# Patient Record
Sex: Female | Born: 1950
Health system: Southern US, Community
[De-identification: ages and names within clinical notes are randomized; demographics above are authoritative.]

## PROBLEM LIST (undated history)

## (undated) DIAGNOSIS — M8580 Other specified disorders of bone density and structure, unspecified site: Secondary | ICD-10-CM

## (undated) DIAGNOSIS — J948 Other specified pleural conditions: Secondary | ICD-10-CM

## (undated) DIAGNOSIS — E785 Hyperlipidemia, unspecified: Secondary | ICD-10-CM

## (undated) DIAGNOSIS — L739 Follicular disorder, unspecified: Secondary | ICD-10-CM

## (undated) HISTORY — DX: Follicular disorder, unspecified: L73.9

## (undated) HISTORY — DX: Hyperlipidemia, unspecified: E78.5

## (undated) HISTORY — DX: Other specified pleural conditions: J94.8

## (undated) HISTORY — DX: Other specified disorders of bone density and structure, unspecified site: M85.80

---

## 1997-05-28 ENCOUNTER — Other Ambulatory Visit: Admission: RE | Admit: 1997-05-28 | Discharge: 1997-05-28 | Payer: Self-pay | Admitting: Gynecology

## 1998-01-04 ENCOUNTER — Other Ambulatory Visit: Admission: RE | Admit: 1998-01-04 | Discharge: 1998-01-04 | Payer: Self-pay | Admitting: Gynecology

## 1999-02-04 ENCOUNTER — Encounter: Payer: Self-pay | Admitting: Gynecology

## 1999-02-04 ENCOUNTER — Encounter: Admission: RE | Admit: 1999-02-04 | Discharge: 1999-02-04 | Payer: Self-pay | Admitting: Gynecology

## 1999-02-13 ENCOUNTER — Other Ambulatory Visit: Admission: RE | Admit: 1999-02-13 | Discharge: 1999-02-13 | Payer: Self-pay | Admitting: Gynecology

## 1999-05-21 ENCOUNTER — Encounter (INDEPENDENT_AMBULATORY_CARE_PROVIDER_SITE_OTHER): Payer: Self-pay

## 1999-05-21 ENCOUNTER — Other Ambulatory Visit: Admission: RE | Admit: 1999-05-21 | Discharge: 1999-05-21 | Payer: Self-pay | Admitting: Gynecology

## 2000-02-10 ENCOUNTER — Encounter: Admission: RE | Admit: 2000-02-10 | Discharge: 2000-02-10 | Payer: Self-pay | Admitting: Gynecology

## 2000-02-10 ENCOUNTER — Encounter: Payer: Self-pay | Admitting: Gynecology

## 2001-02-22 ENCOUNTER — Other Ambulatory Visit: Admission: RE | Admit: 2001-02-22 | Discharge: 2001-02-22 | Payer: Self-pay | Admitting: Gynecology

## 2002-02-22 ENCOUNTER — Other Ambulatory Visit: Admission: RE | Admit: 2002-02-22 | Discharge: 2002-02-22 | Payer: Self-pay | Admitting: Gynecology

## 2002-03-21 ENCOUNTER — Encounter: Admission: RE | Admit: 2002-03-21 | Discharge: 2002-03-21 | Payer: Self-pay | Admitting: Gynecology

## 2002-03-21 ENCOUNTER — Encounter: Payer: Self-pay | Admitting: Gynecology

## 2003-03-20 ENCOUNTER — Other Ambulatory Visit: Admission: RE | Admit: 2003-03-20 | Discharge: 2003-03-20 | Payer: Self-pay | Admitting: Gynecology

## 2003-03-21 ENCOUNTER — Other Ambulatory Visit: Admission: RE | Admit: 2003-03-21 | Discharge: 2003-03-21 | Payer: Self-pay | Admitting: Gynecology

## 2003-03-27 ENCOUNTER — Encounter: Admission: RE | Admit: 2003-03-27 | Discharge: 2003-03-27 | Payer: Self-pay | Admitting: Gynecology

## 2003-10-03 ENCOUNTER — Other Ambulatory Visit: Admission: RE | Admit: 2003-10-03 | Discharge: 2003-10-03 | Payer: Self-pay | Admitting: Gynecology

## 2004-04-14 ENCOUNTER — Other Ambulatory Visit: Admission: RE | Admit: 2004-04-14 | Discharge: 2004-04-14 | Payer: Self-pay | Admitting: Gynecology

## 2004-05-06 ENCOUNTER — Encounter: Admission: RE | Admit: 2004-05-06 | Discharge: 2004-05-06 | Payer: Self-pay | Admitting: Gynecology

## 2004-11-13 ENCOUNTER — Other Ambulatory Visit: Admission: RE | Admit: 2004-11-13 | Discharge: 2004-11-13 | Payer: Self-pay | Admitting: Gynecology

## 2005-05-20 ENCOUNTER — Encounter: Admission: RE | Admit: 2005-05-20 | Discharge: 2005-05-20 | Payer: Self-pay | Admitting: Gynecology

## 2005-11-10 ENCOUNTER — Other Ambulatory Visit: Admission: RE | Admit: 2005-11-10 | Discharge: 2005-11-10 | Payer: Self-pay | Admitting: Gynecology

## 2006-07-13 ENCOUNTER — Encounter: Admission: RE | Admit: 2006-07-13 | Discharge: 2006-07-13 | Payer: Self-pay | Admitting: Gynecology

## 2007-08-04 ENCOUNTER — Encounter: Admission: RE | Admit: 2007-08-04 | Discharge: 2007-08-04 | Payer: Self-pay | Admitting: Gynecology

## 2008-08-21 ENCOUNTER — Encounter: Admission: RE | Admit: 2008-08-21 | Discharge: 2008-08-21 | Payer: Self-pay | Admitting: Gynecology

## 2009-08-23 ENCOUNTER — Encounter: Admission: RE | Admit: 2009-08-23 | Discharge: 2009-08-23 | Payer: Self-pay | Admitting: Gynecology

## 2010-07-29 ENCOUNTER — Other Ambulatory Visit: Payer: Self-pay | Admitting: Gynecology

## 2010-07-29 DIAGNOSIS — Z1231 Encounter for screening mammogram for malignant neoplasm of breast: Secondary | ICD-10-CM

## 2010-09-16 ENCOUNTER — Ambulatory Visit
Admission: RE | Admit: 2010-09-16 | Discharge: 2010-09-16 | Disposition: A | Discharge status: Home / Self Care | Payer: BC Managed Care – PPO | Source: Ambulatory Visit | Attending: Gynecology | Admitting: Gynecology

## 2010-09-16 DIAGNOSIS — Z1231 Encounter for screening mammogram for malignant neoplasm of breast: Secondary | ICD-10-CM

## 2011-08-26 ENCOUNTER — Other Ambulatory Visit: Payer: Self-pay | Admitting: Gynecology

## 2011-08-26 DIAGNOSIS — Z1231 Encounter for screening mammogram for malignant neoplasm of breast: Secondary | ICD-10-CM

## 2011-09-17 ENCOUNTER — Ambulatory Visit
Admission: RE | Admit: 2011-09-17 | Discharge: 2011-09-17 | Disposition: A | Discharge status: Home / Self Care | Payer: BC Managed Care – PPO | Source: Ambulatory Visit | Attending: Gynecology | Admitting: Gynecology

## 2011-09-17 DIAGNOSIS — Z1231 Encounter for screening mammogram for malignant neoplasm of breast: Secondary | ICD-10-CM

## 2012-08-08 ENCOUNTER — Other Ambulatory Visit: Payer: Self-pay

## 2012-08-08 DIAGNOSIS — Z1231 Encounter for screening mammogram for malignant neoplasm of breast: Secondary | ICD-10-CM

## 2012-09-28 ENCOUNTER — Ambulatory Visit
Admission: RE | Admit: 2012-09-28 | Discharge: 2012-09-28 | Disposition: A | Discharge status: Home / Self Care | Payer: BC Managed Care – PPO | Source: Ambulatory Visit

## 2012-09-28 DIAGNOSIS — Z1231 Encounter for screening mammogram for malignant neoplasm of breast: Secondary | ICD-10-CM

## 2013-09-01 ENCOUNTER — Other Ambulatory Visit: Payer: Self-pay

## 2013-09-01 DIAGNOSIS — Z1231 Encounter for screening mammogram for malignant neoplasm of breast: Secondary | ICD-10-CM

## 2013-10-02 ENCOUNTER — Ambulatory Visit
Admission: RE | Admit: 2013-10-02 | Discharge: 2013-10-02 | Disposition: A | Discharge status: Home / Self Care | Payer: BC Managed Care – PPO | Source: Ambulatory Visit

## 2013-10-02 DIAGNOSIS — Z1231 Encounter for screening mammogram for malignant neoplasm of breast: Secondary | ICD-10-CM

## 2019-03-23 ENCOUNTER — Other Ambulatory Visit: Payer: Self-pay | Admitting: Internal Medicine

## 2019-03-23 DIAGNOSIS — E785 Hyperlipidemia, unspecified: Secondary | ICD-10-CM

## 2019-04-06 ENCOUNTER — Ambulatory Visit: Payer: BC Managed Care – PPO

## 2019-04-06 ENCOUNTER — Ambulatory Visit
Admission: RE | Admit: 2019-04-06 | Discharge: 2019-04-06 | Disposition: A | Discharge status: Home / Self Care | Payer: No Typology Code available for payment source | Source: Ambulatory Visit | Attending: Internal Medicine | Admitting: Internal Medicine

## 2019-04-06 DIAGNOSIS — E785 Hyperlipidemia, unspecified: Secondary | ICD-10-CM

## 2019-04-17 ENCOUNTER — Other Ambulatory Visit: Payer: Self-pay | Admitting: Internal Medicine

## 2019-04-17 ENCOUNTER — Ambulatory Visit: Payer: BC Managed Care – PPO

## 2019-04-17 DIAGNOSIS — R222 Localized swelling, mass and lump, trunk: Secondary | ICD-10-CM

## 2019-04-28 ENCOUNTER — Other Ambulatory Visit: Payer: BC Managed Care – PPO

## 2019-05-08 ENCOUNTER — Ambulatory Visit
Admission: RE | Admit: 2019-05-08 | Discharge: 2019-05-08 | Disposition: A | Discharge status: Home / Self Care | Payer: BC Managed Care – PPO | Source: Ambulatory Visit | Attending: Internal Medicine | Admitting: Internal Medicine

## 2019-05-08 ENCOUNTER — Other Ambulatory Visit: Payer: Self-pay

## 2019-05-08 DIAGNOSIS — R918 Other nonspecific abnormal finding of lung field: Secondary | ICD-10-CM | POA: Diagnosis not present

## 2019-05-08 DIAGNOSIS — R222 Localized swelling, mass and lump, trunk: Secondary | ICD-10-CM

## 2019-05-08 MED ORDER — IOPAMIDOL (ISOVUE-300) INJECTION 61%
75.0000 mL | Freq: Once | INTRAVENOUS | Status: AC | PRN
  Administered 2019-05-08: 75 mL via INTRAVENOUS

## 2019-05-16 ENCOUNTER — Other Ambulatory Visit: Payer: Self-pay

## 2019-05-16 ENCOUNTER — Institutional Professional Consult (permissible substitution): Payer: Medicare PPO | Admitting: Cardiothoracic Surgery

## 2019-05-16 VITALS — BP 133/76 | HR 81 | Temp 97.9°F | Resp 20 | Ht 68.25 in | Wt 156.0 lb

## 2019-05-16 DIAGNOSIS — J948 Other specified pleural conditions: Secondary | ICD-10-CM | POA: Insufficient documentation

## 2019-05-16 DIAGNOSIS — N905 Atrophy of vulva: Secondary | ICD-10-CM | POA: Insufficient documentation

## 2019-05-16 DIAGNOSIS — Z780 Asymptomatic menopausal state: Secondary | ICD-10-CM | POA: Insufficient documentation

## 2019-05-16 DIAGNOSIS — R222 Localized swelling, mass and lump, trunk: Secondary | ICD-10-CM

## 2019-05-16 NOTE — Progress Notes (Signed)
[address]       [place] [address]             [phone]                    Francine C Doan [facility] Medical Record [id number] Date of Birth: Aug 18, 1950  Referring: Haywood Pao, MD Primary Care: Tisovec, Fransico Him, MD Primary Cardiologist: No primary care provider on file.  Chief Complaint:    Chief Complaint  Patient presents with  . Consult    Surgical eval on Pleural mass, Chest CT 05/08/19, Cardiac CT 04/06/19    History of Present Illness:    Beth Dean 69 y.o. female is seen in the office  today for abnormal CT of the chest.  The patient noted because of elevation in her cholesterol level a CT was done for calcium scoring-on the CT scan was noted and incompletely imaged right lower pleural base chest mass.  Follow-up full CT of the chest was done and the patient referred to thoracic surgery office.   Patient has been healthy with no significant medical issues.  She specifically denies any shortness of breath chest discomfort has had no pain in the right lower chest or dysesthesias of the right chest.   She smoked 2 to 3 years while in college 40 years ago none since   She notes recently a small lesion squamous cell carcinoma of the skin was removed from her right forearm  Current Activity/ Functional Status:  Patient is independent with mobility/ambulation, transfers, ADL's, IADL's.   Zubrod Score: At the time of surgery this patient's most appropriate activity status/level should be described as: [x]     0    Normal activity, no symptoms []     1    Restricted in physical strenuous activity but ambulatory, able to do out light work []     2    Ambulatory and capable of self care, unable to do work activities, up and about               >50 % of waking hours                              []     3    Only limited self care, in bed greater than 50% of waking hours []     4    Completely disabled, no self care, confined to bed or  chair []     5    Moribund   Past Medical History:  Diagnosis Date  . Folliculitis   . Hyperlipidemia   . Osteopenia   . Pleural mass     Past Surgical History:  Procedure Laterality Date  . LEEP    . TONSILLECTOMY      Family History  Problem Relation Age of Onset  . Heart disease Mother   . Cancer Father   . Hyperlipidemia Sister    Family history significant for her father who died at age 17 of lung cancer metastatic to the brain, mother had bypass surgery ultimately died of a stroke at age 23. -2 children who are healthy  Social History   Tobacco Use  Smoking Status Former Research scientist (life sciences)  . Types: Cigarettes  Smokeless Tobacco Never Used  Tobacco Comment   quit 35 years ago    Social History   Substance and Sexual Activity  Alcohol Use Yes  .  Alcohol/week: 1.0 standard drinks  . Types: 1 Glasses of wine per week     No Known Allergies  Current Outpatient Medications  Medication Sig Dispense Refill  . fenoprofen (NALFON) 600 MG TABS tablet Take 600 mg by mouth.    . Multiple Vitamin (MULTI-VITAMIN DAILY PO) Take by mouth.    . rosuvastatin (CRESTOR) 10 MG tablet     . zinc gluconate 50 MG tablet Take 50 mg by mouth daily.     No current facility-administered medications for this visit.      Review of Systems:     Cardiac Review of Systems: [Y] = yes  or   [ N ] = no   Chest Pain [  n  ]  Resting SOB [n  ] Exertional SOB  [  n]  Orthopnea [ n ]   Pedal Edema [ n  ]    Palpitations [ n ] Syncope  [  n]   Presyncope [  n ]   General Review of Systems: [Y] = yes [  ]=no Constitional: recent weight change [  ];  Wt loss over the last 3 months [   ] anorexia [  ]; fatigue [  ]; nausea [  ]; night sweats [  ]; fever [  ]; or chills [  ];           Eye : blurred vision [  ]; diplopia [   ]; vision changes [  ];  Amaurosis fugax[  ]; Resp: cough [  ];  wheezing[  ];  hemoptysis[  ]; shortness of breath[  ]; paroxysmal nocturnal dyspnea[  ]; dyspnea on exertion[  ];  or orthopnea[  ];  GI:  gallstones[  ], vomiting[  ];  dysphagia[  ]; melena[  ];  hematochezia [  ]; heartburn[  ];   Hx of  Colonoscopy[  ]; GU: kidney stones [  ]; hematuria[  ];   dysuria [  ];  nocturia[  ];  history of     obstruction [  ]; urinary frequency [  ]             Skin: rash, swelling[  ];, hair loss[  ];  peripheral edema[  ];  or itching[  ]; Musculosketetal: myalgias[  ];  joint swelling[  ];  joint erythema[  ];  joint pain[  ];  back pain[  ];  Heme/Lymph: bruising[  ];  bleeding[  ];  anemia[  ];  Neuro: TIA[  ];  headaches[  ];  stroke[  ];  vertigo[  ];  seizures[  ];   paresthesias[  ];  difficulty walking[  ];  Psych:depression[  ]; anxiety[  ];  Endocrine: diabetes[  ];  thyroid dysfunction[  ];  Immunizations: Flu up to date [  ]; Pneumococcal up to date [  ];  Other:     PHYSICAL EXAMINATION: BP 133/76   Pulse 81   Temp 97.9 F (36.6 C) (Skin)   Resp 20   Ht 5' 8.25" (1.734 m)   Wt 156 lb (70.8 kg)   SpO2 95%   BMI 23.55 kg/m  General appearance: alert, cooperative and no distress Head: Normocephalic, without obvious abnormality, atraumatic Neck: no adenopathy, no carotid bruit, no JVD, supple, symmetrical, trachea midline and thyroid not enlarged, symmetric, no tenderness/mass/nodules Lymph nodes: Cervical, supraclavicular, and axillary nodes normal. Resp: clear to auscultation bilaterally Cardio: regular rate and rhythm, S1, S2 normal, no murmur, click, rub or  gallop GI: soft, non-tender; bowel sounds normal; no masses,  no organomegaly Extremities: extremities normal, atraumatic, no cyanosis or edema Neurologic: Grossly normal  Diagnostic Studies & Laboratory data:     Recent Radiology Findings:   CT CHEST W CONTRAST  Result Date: 05/09/2019 CLINICAL DATA:  Incidental pulmonary mass identified on chest CT for calcium scoring. EXAM: CT CHEST WITH CONTRAST TECHNIQUE: Multidetector CT imaging of the chest was performed during intravenous contrast  administration. CONTRAST:  36mL ISOVUE-300 IOPAMIDOL (ISOVUE-300) INJECTION 61% COMPARISON:  CT 04/06/2019 FINDINGS: Cardiovascular: No significant vascular findings. Normal heart size. No pericardial effusion. Mediastinum/Nodes: No axillary or supraclavicular adenopathy. No mediastinal hilar adenopathy. No pericardial effusion. Esophagus normal. Lungs/Pleura: Ovoid solid mass along the pleural surface of the RIGHT lower lobe measures 7.9 x 3.5 by 4.3 cm. The mass is smoothly marginated and shares a broad 7 cm surface with the posterior pleura as well as the pleural surface of the diaphragm. There is vascular supply from the a pulmonary artery with serpiginous vessels noted within the mass. No evidence of involvement of the chest wall or adjacent ribs. Elsewhere in the lungs no nodularity.  Airways are normal. Upper Abdomen: Limited view of the liver, kidneys, pancreas are unremarkable. Normal adrenal glands. Musculoskeletal: No aggressive osseous lesion. IMPRESSION: 1. 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 Recommend cardiothoracic surgery consultation for potential resection. Multidisciplinary Thoracic Clinic [phone]. 2. Otherwise normal CT of thorax. These results will be called to the ordering clinician or representative by the Radiologist Assistant, and communication documented in the PACS or zVision Dashboard. Electronically Signed   By: Suzy Bouchard M.D.   On: 05/09/2019 09:37     I have independently reviewed the above radiology studies  and reviewed the findings with the patient.  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  We reviewed that optimal treatment involved resection.  The typical course is slow growth.  Patient was hesitant about proceeding immediately to surgery.  We suggested alternative was needle biopsy of the mass to obtain a pathologic diagnosis and follow-up with CT scan with possible suction in the summer.  Patient was somewhat hesitant about needle biopsy and will think it over it and call us back, at the minimum will obtain a follow-up CT scan in June 2021.     Beth Isaac MD      [address] ,[place] [address] Office [phone]     05/16/2019 5:46 PM

## 2019-05-17 ENCOUNTER — Encounter: Payer: Self-pay | Admitting: Internal Medicine

## 2019-05-18 ENCOUNTER — Other Ambulatory Visit: Payer: Self-pay | Admitting: Cardiothoracic Surgery

## 2019-05-18 ENCOUNTER — Encounter (HOSPITAL_COMMUNITY): Payer: Self-pay | Admitting: Radiology

## 2019-05-18 DIAGNOSIS — R222 Localized swelling, mass and lump, trunk: Secondary | ICD-10-CM

## 2019-05-18 NOTE — Progress Notes (Signed)
Beth Dean Female, 69 y.o., [birth year] MRN:  [id number] Phone:  [phone] [name]) PCP:  Haywood Pao, MD Coverage:  [facility] Medicare/[employer] Medicare Choice Ppo  RE: CT Biopsy Received: Today Message Contents  Sandi Mariscal, MD  Garth Bigness D  CT guided R LL pleural based mass - Chest CT 05/08/2019 - image 125, series 2.   Per Dr. Everrett Coombe note - pt does NOT want surgery but has agreed to Bx.    Please schedule at [facility].   [name]       Previous Messages   ----- Message -----  From: Garth Bigness D  Sent: 05/18/2019  1:49 PM EST  To: [facility] Procedure Requests  Subject: CT Biopsy                     Procedure: CT Biopsy   Reason: Chest wall Mass, right   History: CT in computer   Provider: Grace Isaac   Provider Contact:  [phone]

## 2019-05-26 DIAGNOSIS — E7849 Other hyperlipidemia: Secondary | ICD-10-CM | POA: Diagnosis not present

## 2019-05-27 ENCOUNTER — Ambulatory Visit (HOSPITAL_COMMUNITY)
Admission: RE | Admit: 2019-05-27 | Discharge: 2019-05-27 | Disposition: A | Discharge status: Home / Self Care | Payer: Medicare PPO | Source: Ambulatory Visit | Attending: Cardiothoracic Surgery | Admitting: Cardiothoracic Surgery

## 2019-05-27 DIAGNOSIS — Z01812 Encounter for preprocedural laboratory examination: Secondary | ICD-10-CM | POA: Diagnosis not present

## 2019-05-27 DIAGNOSIS — Z20822 Contact with and (suspected) exposure to covid-19: Secondary | ICD-10-CM | POA: Insufficient documentation

## 2019-05-27 LAB — SARS CORONAVIRUS 2 (TAT 6-24 HRS): SARS Coronavirus 2: NEGATIVE

## 2019-05-29 ENCOUNTER — Other Ambulatory Visit: Payer: Self-pay | Admitting: Radiology

## 2019-05-30 ENCOUNTER — Other Ambulatory Visit: Payer: Self-pay

## 2019-05-30 ENCOUNTER — Encounter (HOSPITAL_COMMUNITY): Payer: Self-pay

## 2019-05-30 ENCOUNTER — Ambulatory Visit (HOSPITAL_COMMUNITY)
Admission: RE | Admit: 2019-05-30 | Discharge: 2019-05-30 | Disposition: A | Discharge status: Home / Self Care | Payer: Medicare PPO | Source: Ambulatory Visit | Attending: Cardiothoracic Surgery | Admitting: Cardiothoracic Surgery

## 2019-05-30 DIAGNOSIS — Z87891 Personal history of nicotine dependence: Secondary | ICD-10-CM | POA: Insufficient documentation

## 2019-05-30 DIAGNOSIS — Z79899 Other long term (current) drug therapy: Secondary | ICD-10-CM | POA: Insufficient documentation

## 2019-05-30 DIAGNOSIS — Z8249 Family history of ischemic heart disease and other diseases of the circulatory system: Secondary | ICD-10-CM | POA: Diagnosis not present

## 2019-05-30 DIAGNOSIS — Z8349 Family history of other endocrine, nutritional and metabolic diseases: Secondary | ICD-10-CM | POA: Diagnosis not present

## 2019-05-30 DIAGNOSIS — E7800 Pure hypercholesterolemia, unspecified: Secondary | ICD-10-CM | POA: Insufficient documentation

## 2019-05-30 DIAGNOSIS — C382 Malignant neoplasm of posterior mediastinum: Secondary | ICD-10-CM | POA: Diagnosis not present

## 2019-05-30 DIAGNOSIS — R222 Localized swelling, mass and lump, trunk: Secondary | ICD-10-CM

## 2019-05-30 DIAGNOSIS — J948 Other specified pleural conditions: Secondary | ICD-10-CM | POA: Diagnosis not present

## 2019-05-30 DIAGNOSIS — R918 Other nonspecific abnormal finding of lung field: Secondary | ICD-10-CM | POA: Diagnosis not present

## 2019-05-30 DIAGNOSIS — D382 Neoplasm of uncertain behavior of pleura: Secondary | ICD-10-CM | POA: Diagnosis not present

## 2019-05-30 LAB — CBC
HCT: 39.3 % (ref 36.0–46.0)
Hemoglobin: 13.1 g/dL (ref 12.0–15.0)
MCH: 30.6 pg (ref 26.0–34.0)
MCHC: 33.3 g/dL (ref 30.0–36.0)
MCV: 91.8 fL (ref 80.0–100.0)
Platelets: 228 10*3/uL (ref 150–400)
RBC: 4.28 MIL/uL (ref 3.87–5.11)
RDW: 13.3 % (ref 11.5–15.5)
WBC: 4.2 10*3/uL (ref 4.0–10.5)
nRBC: 0 % (ref 0.0–0.2)

## 2019-05-30 LAB — PROTIME-INR
INR: 0.9 (ref 0.8–1.2)
Prothrombin Time: 11.7 seconds (ref 11.4–15.2)

## 2019-05-30 MED ORDER — FENTANYL CITRATE (PF) 100 MCG/2ML IJ SOLN
INTRAMUSCULAR | Status: AC | PRN
  Administered 2019-05-30: 50 ug via INTRAVENOUS
  Administered 2019-05-30: 25 ug via INTRAVENOUS

## 2019-05-30 MED ORDER — HYDROCODONE-ACETAMINOPHEN 5-325 MG PO TABS: 1.0000 | ORAL_TABLET | ORAL | Status: DC | PRN

## 2019-05-30 MED ORDER — MIDAZOLAM HCL 2 MG/2ML IJ SOLN
INTRAMUSCULAR | Status: AC
  Filled 2019-05-30: qty 2

## 2019-05-30 MED ORDER — MIDAZOLAM HCL 2 MG/2ML IJ SOLN
INTRAMUSCULAR | Status: AC | PRN
  Administered 2019-05-30: 1 mg via INTRAVENOUS
  Administered 2019-05-30: 0.5 mg via INTRAVENOUS

## 2019-05-30 MED ORDER — FENTANYL CITRATE (PF) 100 MCG/2ML IJ SOLN
INTRAMUSCULAR | Status: AC
  Filled 2019-05-30: qty 2

## 2019-05-30 NOTE — Procedures (Signed)
  Procedure: CT core R pleural mass   EBL:   minimal Complications:  none immediate  See full dictation in [facility] PACS.  D. Layliana Devins MD Main # [phone] Pager  [phone]    

## 2019-05-30 NOTE — Discharge Instructions (Addendum)
Needle Biopsy, Care After These instructions tell you how to care for yourself after your procedure. Your doctor may also give you more specific instructions. Call your doctor if you have any problems or questions. What can I expect after the procedure? After the procedure, it is common to have:  Soreness.  Bruising.  Mild pain. Follow these instructions at home:   Return to your normal activities as told by your doctor. Ask your doctor what activities are safe for you.  Take over-the-counter and prescription medicines only as told by your doctor.  Wash your hands with soap and water before you change your bandage (dressing). If you cannot use soap and water, use hand sanitizer.  Follow instructions from your doctor about: ? How to take care of your puncture site. ? When and how to change your bandage. ? When to remove your bandage.  Check your puncture site every day for signs of infection. Watch for: ? Redness, swelling, or pain. ? Fluid or blood. ? Pus or a bad smell. ? Warmth.  Do not take baths, swim, or use a hot tub until your doctor approves. Ask your doctor if you may take showers. You may only be allowed to take sponge baths.  Keep all follow-up visits as told by your doctor. This is important. Contact a doctor if you have:  A fever.  Redness, swelling, or pain at the puncture site, and it lasts longer than a few days.  Fluid, blood, or pus coming from the puncture site.  Warmth coming from the puncture site. Get help right away if:  You have a lot of bleeding from the puncture site. Summary  After the procedure, it is common to have soreness, bruising, or mild pain at the puncture site.  Check your puncture site every day for signs of infection, such as redness, swelling, or pain.  Get help right away if you have severe bleeding from your puncture site. This information is not intended to replace advice given to you by your health care provider. Make  sure you discuss any questions you have with your health care provider. Document Revised: 03/08/2017 Document Reviewed: 03/08/2017 Elsevier Patient Education  2020 Elsevier Inc. Moderate Conscious Sedation, Adult Sedation is the use of medicines to promote relaxation and relieve discomfort and anxiety. Moderate conscious sedation is a type of sedation. Under moderate conscious sedation, you are less alert than normal, but you are still able to respond to instructions, touch, or both. Moderate conscious sedation is used during short medical and dental procedures. It is milder than deep sedation, which is a type of sedation under which you cannot be easily woken up. It is also milder than general anesthesia, which is the use of medicines to make you unconscious. Moderate conscious sedation allows you to return to your regular activities sooner. Tell a health care provider about:  Any allergies you have.  All medicines you are taking, including vitamins, herbs, eye drops, creams, and over-the-counter medicines.  Use of steroids (by mouth or creams).  Any problems you or family members have had with sedatives and anesthetic medicines.  Any blood disorders you have.  Any surgeries you have had.  Any medical conditions you have, such as sleep apnea.  Whether you are pregnant or may be pregnant.  Any use of cigarettes, alcohol, marijuana, or street drugs. What are the risks? Generally, this is a safe procedure. However, problems may occur, including:  Getting too much medicine (oversedation).  Nausea.  Allergic reaction to   medicines.  Trouble breathing. If this happens, a breathing tube may be used to help with breathing. It will be removed when you are awake and breathing on your own.  Heart trouble.  Lung trouble. What happens before the procedure? Staying hydrated Follow instructions from your health care provider about hydration, which may include:  Up to 2 hours before the  procedure - you may continue to drink clear liquids, such as water, clear fruit juice, black coffee, and plain tea. Eating and drinking restrictions Follow instructions from your health care provider about eating and drinking, which may include:  8 hours before the procedure - stop eating heavy meals or foods such as meat, fried foods, or fatty foods.  6 hours before the procedure - stop eating light meals or foods, such as toast or cereal.  6 hours before the procedure - stop drinking milk or drinks that contain milk.  2 hours before the procedure - stop drinking clear liquids. Medicine Ask your health care provider about:  Changing or stopping your regular medicines. This is especially important if you are taking diabetes medicines or blood thinners.  Taking medicines such as aspirin and ibuprofen. These medicines can thin your blood. Do not take these medicines before your procedure if your health care provider instructs you not to.  Tests and exams  You will have a physical exam.  You may have blood tests done to show: ? How well your kidneys and liver are working. ? How well your blood can clot. General instructions  Plan to have someone take you home from the hospital or clinic.  If you will be going home right after the procedure, plan to have someone with you for 24 hours. What happens during the procedure?  An IV tube will be inserted into one of your veins.  Medicine to help you relax (sedative) will be given through the IV tube.  The medical or dental procedure will be performed. What happens after the procedure?  Your blood pressure, heart rate, breathing rate, and blood oxygen level will be monitored often until the medicines you were given have worn off.  Do not drive for 24 hours. This information is not intended to replace advice given to you by your health care provider. Make sure you discuss any questions you have with your health care provider. Document  Revised: 02/05/2017 Document Reviewed: 06/15/2015 Elsevier Patient Education  2020 Elsevier Inc.  

## 2019-05-30 NOTE — H&P (Signed)
Chief Complaint: Patient was seen in consultation today for right lung mass biopsy at the request of Gerhardt,Edward B  Referring Physician(s): Grace Isaac  Supervising Physician: Arne Cleveland  Patient Status: [facility] - Out-pt  History of Present Illness: Beth Dean is a 69 y.o. female   Pt with high cholesterol--- MD recommended CT Heart  04/06/19: 2. Incompletely imaged pleural-based mass within the inferior right hemithorax. 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 Recommend dedicated contrast-enhanced diagnostic chest CT.  Additional CT 05/08/19: IMPRESSION: 1. 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 Recommend cardiothoracic surgery consultation for potential resection. Multidisciplinary Thoracic Clinic [phone]. 2. Otherwise normal CT of thorax.  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  We reviewed that optimal treatment involved resection.  The typical course is slow growth.  Patient was hesitant about proceeding immediately to surgery.  We suggested alternative was needle biopsy of the mass to obtain a pathologic diagnosis and follow-up with CT scan with possible resection in the summer.   Now scheduled for biopsy of RLL pleural based mass  She denies any pain; SOB Completely asymptomatic  Past Medical History:  Diagnosis Date  . Folliculitis   . Hyperlipidemia   . Osteopenia   . Pleural mass     Past Surgical History:  Procedure Laterality Date  . LEEP    . TONSILLECTOMY      Allergies: Patient has no known allergies.  Medications: Prior to Admission medications   Medication Sig  Start Date End Date Taking? Authorizing Provider  Calcium Carb-Cholecalciferol (CALCIUM 600 + D PO) Take 2 tablets by mouth daily.   Yes [provider]  Multiple Vitamin (MULTI-VITAMIN DAILY PO) Take 1 tablet by mouth daily.    Yes [provider]  Multiple Vitamins-Minerals (LUTEIN-ZEAXANTHIN PO) Take 1 tablet by mouth daily.   Yes [provider]  rosuvastatin (CRESTOR) 10 MG tablet Take 10 mg by mouth daily.  04/10/19  Yes [provider]  zinc gluconate 50 MG tablet Take 50 mg by mouth daily.   Yes [provider]     Family History  Problem Relation Age of Onset  . Heart disease Mother   . Cancer Father   . Hyperlipidemia Sister     Social History   Socioeconomic History  . Marital status: Married    Spouse name: Not on file  . Number of children: Not on file  . Years of education: Not on file  . Highest education level: Not on file  Occupational History  . Not on file  Tobacco Use  . Smoking status: Former Smoker    Types: Cigarettes  . Smokeless tobacco: Never Used  . Tobacco comment: quit 35 years ago  Substance and Sexual Activity  . Alcohol use: Yes    Alcohol/week: 1.0 standard drinks    Types: 1 Glasses of wine per week  . Drug use: Never  . Sexual activity: Not Currently  Other Topics Concern  . Not on file  Social History Narrative   Tutor/elementary sub   Has a masters/ quit smoking 35 years ago, some in college/social   Drinks wine 3x per week  Social Determinants of Health   Financial Resource Strain:   . Difficulty of Paying Living Expenses:   Food Insecurity:   . Worried About Charity fundraiser in the Last Year:   . Arboriculturist in the Last Year:   Transportation Needs:   . Film/video editor (Medical):   Marland Kitchen Lack of Transportation (Non-Medical):   Physical Activity:   . Days of Exercise per Week:   . Minutes of Exercise per Session:   Stress:   . Feeling of Stress :   Social Connections:    . Frequency of Communication with Friends and Family:   . Frequency of Social Gatherings with Friends and Family:   . Attends Religious Services:   . Active Member of Clubs or Organizations:   . Attends Archivist Meetings:   Marland Kitchen Marital Status:     Review of Systems: A 12 point ROS discussed and pertinent positives are indicated in the HPI above.  All other systems are negative.  Review of Systems  Constitutional: Negative for activity change, fatigue, fever and unexpected weight change.  Respiratory: Negative for cough and shortness of breath.   Cardiovascular: Negative for chest pain.  Gastrointestinal: Negative for abdominal pain.  Neurological: Negative for weakness.  Psychiatric/Behavioral: Negative for behavioral problems and confusion.    Vital Signs: BP (!) 125/59   Pulse 72   Temp 98.1 F (36.7 C) (Skin)   Resp 16   Ht 5\' 8"  (1.727 m)   Wt 154 lb (69.9 kg)   SpO2 98%   BMI 23.42 kg/m   Physical Exam Vitals reviewed.  HENT:     Head: Atraumatic.     Mouth/Throat:     Mouth: Mucous membranes are moist.  Eyes:     Extraocular Movements: Extraocular movements intact.  Cardiovascular:     Rate and Rhythm: Normal rate and regular rhythm.     Heart sounds: Normal heart sounds.  Pulmonary:     Effort: Pulmonary effort is normal.     Breath sounds: Normal breath sounds.  Abdominal:     Palpations: Abdomen is soft.  Musculoskeletal:        General: Normal range of motion.  Skin:    General: Skin is warm and dry.  Neurological:     Mental Status: She is alert and oriented to person, place, and time.  Psychiatric:        Behavior: Behavior normal.        Thought Content: Thought content normal.        Judgment: Judgment normal.     Imaging: CT CHEST W CONTRAST  Result Date: 05/09/2019 CLINICAL DATA:  Incidental pulmonary mass identified on chest CT for calcium scoring. EXAM: CT CHEST WITH CONTRAST TECHNIQUE: Multidetector CT imaging of the chest  was performed during intravenous contrast administration. CONTRAST:  27mL ISOVUE-300 IOPAMIDOL (ISOVUE-300) INJECTION 61% COMPARISON:  CT 04/06/2019 FINDINGS: Cardiovascular: No significant vascular findings. Normal heart size. No pericardial effusion. Mediastinum/Nodes: No axillary or supraclavicular adenopathy. No mediastinal hilar adenopathy. No pericardial effusion. Esophagus normal. Lungs/Pleura: Ovoid solid mass along the pleural surface of the RIGHT lower lobe measures 7.9 x 3.5 by 4.3 cm. The mass is smoothly marginated and shares a broad 7 cm surface with the posterior pleura as well as the pleural surface of the diaphragm. There is vascular supply from the a pulmonary artery with serpiginous vessels noted within the mass. No evidence of involvement of the chest wall or adjacent ribs. Elsewhere in the  lungs no nodularity.  Airways are normal. Upper Abdomen: Limited view of the liver, kidneys, pancreas are unremarkable. Normal adrenal glands. Musculoskeletal: No aggressive osseous lesion. IMPRESSION: 1. 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 Recommend cardiothoracic surgery consultation for potential resection. Multidisciplinary Thoracic Clinic [phone]. 2. Otherwise normal CT of thorax. These results will be called to the ordering clinician or representative by the Radiologist Assistant, and communication documented in the PACS or zVision Dashboard. Electronically Signed   By: Suzy Bouchard M.D.   On: 05/09/2019 09:37    Labs:  CBC: Recent Labs    05/30/19 0921  WBC 4.2  HGB 13.1  HCT 39.3  PLT 228    COAGS: Recent Labs    05/30/19 0921  INR 0.9    BMP: No results for input(s): NA, K, CL, CO2, GLUCOSE, BUN, CALCIUM, CREATININE, GFRNONAA, GFRAA in the last 8760 hours.  Invalid input(s): CMP  LIVER FUNCTION TESTS: No results for input(s): BILITOT, AST,  ALT, ALKPHOS, PROT, ALBUMIN in the last 8760 hours.  TUMOR MARKERS: No results for input(s): AFPTM, CEA, CA199, CHROMGRNA in the last 8760 hours.  Assessment and Plan:  Right lower lobe pleural based mass Incidental finding after CT Cardiac study for hyperlipidemia Was seen by Dr Geoffry Paradise- now scheduled for biopsy of lesion Risks and benefits of CT guided lung nodule biopsy was discussed with the patient including, but not limited to bleeding, hemoptysis, respiratory failure requiring intubation, infection, pneumothorax requiring chest tube placement, stroke from air embolism or even death.  All of the patient's questions were answered and the patient is agreeable to proceed. Consent signed and in chart.    Thank you for this interesting consult.  I greatly enjoyed meeting SHELANA COCKCROFT and look forward to participating in their care.  A copy of this report was sent to the requesting provider on this date.  Electronically Signed: Lavonia Drafts, PA-C 05/30/2019, 10:10 AM   I spent a total of  30 Minutes   in face to face in clinical consultation, greater than 50% of which was counseling/coordinating care for RLL pleural based mass biopsy

## 2019-06-01 LAB — SURGICAL PATHOLOGY

## 2019-06-07 ENCOUNTER — Telehealth: Payer: Self-pay | Admitting: Cardiothoracic Surgery

## 2019-06-07 NOTE — Telephone Encounter (Signed)
Call patient and reviewed recent CT directed needle biopsy of pleural-based mass-Path shows solitary fibrous tumor of the chest.  Again reviewed with the patient options for resection.  She is agreeable with a follow-up CT scan late June or early July and consider resection at that time.  Beth Isaac MD      [address] [place] [address] Office [phone]

## 2019-06-16 ENCOUNTER — Encounter: Payer: Self-pay | Admitting: *Deleted

## 2019-06-16 ENCOUNTER — Other Ambulatory Visit: Payer: Self-pay | Admitting: *Deleted

## 2019-06-16 DIAGNOSIS — R222 Localized swelling, mass and lump, trunk: Secondary | ICD-10-CM

## 2019-07-17 ENCOUNTER — Other Ambulatory Visit: Payer: Self-pay | Admitting: Cardiothoracic Surgery

## 2019-07-17 DIAGNOSIS — R222 Localized swelling, mass and lump, trunk: Secondary | ICD-10-CM

## 2019-07-27 ENCOUNTER — Ambulatory Visit: Payer: Medicare PPO | Admitting: Cardiothoracic Surgery

## 2019-07-27 ENCOUNTER — Other Ambulatory Visit: Payer: Self-pay

## 2019-07-27 VITALS — BP 125/72 | HR 68 | Temp 97.9°F | Resp 20 | Ht 68.0 in | Wt 155.0 lb

## 2019-07-27 DIAGNOSIS — R222 Localized swelling, mass and lump, trunk: Secondary | ICD-10-CM | POA: Diagnosis not present

## 2019-07-27 NOTE — Progress Notes (Signed)
[address]       ,[place] [address]             [phone]                    Cameran C Rupert Gilmer Medical Record [id number] Date of Birth: Jan 08, 1951  Referring: Haywood Pao, MD Primary Care: Tisovec, Fransico Him, MD Primary Cardiologist: No primary care provider on file.  Chief Complaint:    Chief Complaint  Patient presents with  . Consult    Further discuss surgery scheduled for 08/02/19    History of Present Illness:    Beth Dean 69 y.o. female is seen in the office for abnormal CT of the chest.  The patient noted because of elevation in her cholesterol level a CT was done for calcium scoring-on the CT scan was noted and incompletely imaged right lower pleural base chest mass.  Follow-up full CT of the chest was done and the patient referred to thoracic surgery office.   Patient has been healthy with no significant medical issues.  She specifically denies any shortness of breath chest discomfort has had no pain in the right lower chest or dysesthesias of the right chest.   She smoked 2 to 3 years while in college 40 years ago none since   She notes recently a small lesion squamous cell carcinoma of the skin was removed from her right forearm.   Since last seen the patient had a CT directed needle biopsy-confirming fibrous tumor of the chest.  After discussing the natural history and reviewing the CT scans with the patient and had been recommended to consider surgical resection.  Initially the patient wanted to wait, but now comes to the office to discuss proceeding with surgical resection.  Current Activity/ Functional Status:  Patient is independent with mobility/ambulation, transfers, ADL's, IADL's.   Zubrod Score: At the time of surgery this patient's most appropriate activity status/level should be described as: [x]     0    Normal activity, no symptoms []     1    Restricted in physical strenuous activity but ambulatory, able  to do out light work []     2    Ambulatory and capable of self care, unable to do work activities, up and about               >50 % of waking hours                              []     3    Only limited self care, in bed greater than 50% of waking hours []     4    Completely disabled, no self care, confined to bed or chair []     5    Moribund   Past Medical History:  Diagnosis Date  . Folliculitis   . Hyperlipidemia   . Osteopenia   . Pleural mass     Past Surgical History:  Procedure Laterality Date  . LEEP    . TONSILLECTOMY      Family History  Problem Relation Age of Onset  . Heart disease Mother   . Cancer Father   . Hyperlipidemia Sister    Family history significant for her father who died at age 19 of lung cancer metastatic to the brain, mother had bypass surgery ultimately died of a stroke at age 74. -  2 children who are healthy  Social History   Tobacco Use  Smoking Status Former Smoker  . Types: Cigarettes  . Quit date: 50  . Years since quitting: 46.4  Smokeless Tobacco Never Used  Tobacco Comment   quit 35 years ago    Social History   Substance and Sexual Activity  Alcohol Use Yes  . Alcohol/week: 1.0 standard drinks  . Types: 1 Glasses of wine per week   Comment: On weekend     No Known Allergies  Current Outpatient Medications  Medication Sig Dispense Refill  . Calcium Carb-Cholecalciferol (CALCIUM 600 + D PO) Take 2 tablets by mouth daily.    . Multiple Vitamin (MULTI-VITAMIN DAILY PO) Take 1 tablet by mouth daily.     . rosuvastatin (CRESTOR) 10 MG tablet Take 10 mg by mouth daily.     Marland Kitchen zinc gluconate 50 MG tablet Take 50 mg by mouth daily.     No current facility-administered medications for this visit.      Review of Systems:     Cardiac Review of Systems: [Y] = yes  or   [ N ] = no   Chest Pain [  n  ]  Resting SOB [n  ] Exertional SOB  [  n]  Orthopnea [ n ]   Pedal Edema [ n  ]    Palpitations [ n ] Syncope  [  n]   Presyncope  [  n ]   General Review of Systems: [Y] = yes [  ]=no Constitional: recent weight change [  ];  Wt loss over the last 3 months [   ] anorexia [  ]; fatigue [  ]; nausea [  ]; night sweats [  ]; fever [  ]; or chills [  ];           Eye : blurred vision [  ]; diplopia [   ]; vision changes [  ];  Amaurosis fugax[  ]; Resp: cough [  ];  wheezing[  ];  hemoptysis[  ]; shortness of breath[  ]; paroxysmal nocturnal dyspnea[  ]; dyspnea on exertion[  ]; or orthopnea[  ];  GI:  gallstones[  ], vomiting[  ];  dysphagia[  ]; melena[  ];  hematochezia [  ]; heartburn[  ];   Hx of  Colonoscopy[  ]; GU: kidney stones [  ]; hematuria[  ];   dysuria [  ];  nocturia[  ];  history of     obstruction [  ]; urinary frequency [  ]             Skin: rash, swelling[  ];, hair loss[  ];  peripheral edema[  ];  or itching[  ]; Musculosketetal: myalgias[  ];  joint swelling[  ];  joint erythema[  ];  joint pain[  ];  back pain[  ];  Heme/Lymph: bruising[  ];  bleeding[  ];  anemia[  ];  Neuro: TIA[  ];  headaches[  ];  stroke[  ];  vertigo[  ];  seizures[  ];   paresthesias[  ];  difficulty walking[  ];  Psych:depression[  ]; anxiety[  ];  Endocrine: diabetes[  ];  thyroid dysfunction[  ];  Immunizations: Flu up to date [  ]; Pneumococcal up to date [  ];  Other:     PHYSICAL EXAMINATION: BP 125/72   Pulse 68   Temp 97.9 F (36.6 C) (Skin)   Resp 20  Ht 5\' 8"  (1.727 m)   Wt 155 lb (70.3 kg)   SpO2 96%   BMI 23.57 kg/m  General appearance: alert and no distress Neck: no adenopathy, no carotid bruit, no JVD, supple, symmetrical, trachea midline and thyroid not enlarged, symmetric, no tenderness/mass/nodules Lymph nodes: Cervical, supraclavicular, and axillary nodes normal. Resp: clear to auscultation bilaterally Cardio: regular rate and rhythm, S1, S2 normal, no murmur, click, rub or gallop GI: soft, non-tender; bowel sounds normal; no masses,  no organomegaly Extremities: extremities normal, atraumatic,  no cyanosis or edema   Diagnostic Studies & Laboratory data:     Recent Radiology Findings:   CT CHEST W CONTRAST  Result Date: 05/09/2019 CLINICAL DATA:  Incidental pulmonary mass identified on chest CT for calcium scoring. EXAM: CT CHEST WITH CONTRAST TECHNIQUE: Multidetector CT imaging of the chest was performed during intravenous contrast administration. CONTRAST:  40mL ISOVUE-300 IOPAMIDOL (ISOVUE-300) INJECTION 61% COMPARISON:  CT 04/06/2019 FINDINGS: Cardiovascular: No significant vascular findings. Normal heart size. No pericardial effusion. Mediastinum/Nodes: No axillary or supraclavicular adenopathy. No mediastinal hilar adenopathy. No pericardial effusion. Esophagus normal. Lungs/Pleura: Ovoid solid mass along the pleural surface of the RIGHT lower lobe measures 7.9 x 3.5 by 4.3 cm. The mass is smoothly marginated and shares a broad 7 cm surface with the posterior pleura as well as the pleural surface of the diaphragm. There is vascular supply from the a pulmonary artery with serpiginous vessels noted within the mass. No evidence of involvement of the chest wall or adjacent ribs. Elsewhere in the lungs no nodularity.  Airways are normal. Upper Abdomen: Limited view of the liver, kidneys, pancreas are unremarkable. Normal adrenal glands. Musculoskeletal: No aggressive osseous lesion. IMPRESSION: 1. 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 Recommend cardiothoracic surgery consultation for potential resection. Multidisciplinary Thoracic Clinic [phone]. 2. Otherwise normal CT of thorax. These results will be called to the ordering clinician or representative by the Radiologist Assistant, and communication documented in the PACS or zVision Dashboard. Electronically Signed   By: Suzy Bouchard M.D.   On: 05/09/2019 09:37     I have independently reviewed the above radiology  studies  and reviewed the findings with the patient.  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  The expectations of postop recovery, possible complications, recovery time have been discussed.  Patient is agreeable with proceeding and we will make arrangements for Wednesday, May 26.   Beth Isaac MD      [address] [place] [address] Office [phone]  07/31/2019 9:38 PM

## 2019-07-28 NOTE — Progress Notes (Signed)
[id number] - [place], [place] [address] AT [address] [place] [address] Phone: [phone] Fax: [phone]      Your procedure is scheduled on Wednesday, Aug 02, 2019 at 7:30 AM.  Report to [facility] Entrance "A" at 5:30 A.M., and check in at the Admitting office.  Call this number if you have problems the morning of surgery:  [phone]  Call [phone] if you have any questions prior to your surgery date Monday-Friday 8am-4pm    Remember:  Do not eat or drink after midnight the night before your surgery     Take these medicines the morning of surgery with A SIP OF WATER:  rosuvastatin (CRESTOR)   As of today, STOP taking any Aspirin (unless otherwise instructed by your surgeon) and Aspirin containing products, Aleve, Naproxen, Ibuprofen, Motrin, Advil, Goody's, BC's, all herbal medications, fish oil, and all vitamins.                      Do not wear jewelry, make up, or nail polish.            Do not wear lotions, powders, perfumes, or deodorant.            Do not shave 48 hours prior to surgery.            Do not bring valuables to the hospital.            [facility] is not responsible for any belongings or valuables.  Do NOT Smoke (Tobacco/Vapping) or drink Alcohol 24 hours prior to your procedure If you use a CPAP at night, you may bring all equipment for your overnight stay.   Contacts, glasses, dentures or bridgework may not be worn into surgery.      For patients admitted to the hospital, discharge time will be determined by your treatment team.   Patients discharged the day of surgery will not be allowed to drive home, and someone needs to stay with them for 24 hours.    Special instructions:   [facility]- Preparing For Surgery  Before surgery, you can play an important role. Because skin is not sterile, your skin needs to be as free of germs as possible. You can reduce the number  of germs on your skin by washing with CHG (chlorahexidine gluconate) Soap before surgery.  CHG is an antiseptic cleaner which kills germs and bonds with the skin to continue killing germs even after washing.    Oral Hygiene is also important to reduce your risk of infection.  Remember - BRUSH YOUR TEETH THE MORNING OF SURGERY WITH YOUR REGULAR TOOTHPASTE  Please do not use if you have an allergy to CHG or antibacterial soaps. If your skin becomes reddened/irritated stop using the CHG.  Do not shave (including legs and underarms) for at least 48 hours prior to first CHG shower. It is OK to shave your face.  Please follow these instructions carefully.   1. Shower the NIGHT BEFORE SURGERY and the MORNING OF SURGERY with CHG Soap.   2. If you chose to wash your hair, wash your hair first as usual with your normal shampoo.  3. After you shampoo, rinse your hair and body thoroughly to remove the shampoo.  4. Use CHG as you would any other liquid soap. You can apply CHG directly to the skin and wash gently with a scrungie or a clean washcloth.   5. Apply the  CHG Soap to your body ONLY FROM THE NECK DOWN.  Do not use on open wounds or open sores. Avoid contact with your eyes, ears, mouth and genitals (private parts). Wash Face and genitals (private parts)  with your normal soap.   6. Wash thoroughly, paying special attention to the area where your surgery will be performed.  7. Thoroughly rinse your body with warm water from the neck down.  8. DO NOT shower/wash with your normal soap after using and rinsing off the CHG Soap.  9. Pat yourself dry with a CLEAN TOWEL.  10. Wear CLEAN PAJAMAS to bed the night before surgery, wear comfortable clothes the morning of surgery  11. Place CLEAN SHEETS on your bed the night of your first shower and DO NOT SLEEP WITH PETS.   Day of Surgery:   Do not apply any deodorants/lotions.  Please wear clean clothes to the hospital/surgery center.   Remember to  brush your teeth WITH YOUR REGULAR TOOTHPASTE.   Please read over the following fact sheets that you were given.

## 2019-07-31 ENCOUNTER — Ambulatory Visit (HOSPITAL_COMMUNITY)
Admission: RE | Admit: 2019-07-31 | Discharge: 2019-07-31 | Disposition: A | Discharge status: Home / Self Care | Payer: Medicare PPO | Source: Ambulatory Visit | Attending: Cardiothoracic Surgery | Admitting: Cardiothoracic Surgery

## 2019-07-31 ENCOUNTER — Encounter (HOSPITAL_COMMUNITY)
Admission: RE | Admit: 2019-07-31 | Discharge: 2019-07-31 | Disposition: A | Discharge status: Home / Self Care | Payer: Medicare PPO | Source: Ambulatory Visit | Attending: Cardiothoracic Surgery | Admitting: Cardiothoracic Surgery

## 2019-07-31 ENCOUNTER — Other Ambulatory Visit: Payer: Self-pay

## 2019-07-31 ENCOUNTER — Encounter (HOSPITAL_COMMUNITY): Payer: Self-pay

## 2019-07-31 ENCOUNTER — Other Ambulatory Visit (HOSPITAL_COMMUNITY)
Admission: RE | Admit: 2019-07-31 | Discharge: 2019-07-31 | Disposition: A | Discharge status: Home / Self Care | Payer: Medicare PPO | Source: Ambulatory Visit | Attending: Cardiothoracic Surgery | Admitting: Cardiothoracic Surgery

## 2019-07-31 DIAGNOSIS — Z87891 Personal history of nicotine dependence: Secondary | ICD-10-CM | POA: Diagnosis not present

## 2019-07-31 DIAGNOSIS — D491 Neoplasm of unspecified behavior of respiratory system: Secondary | ICD-10-CM | POA: Diagnosis present

## 2019-07-31 DIAGNOSIS — L739 Follicular disorder, unspecified: Secondary | ICD-10-CM | POA: Diagnosis present

## 2019-07-31 DIAGNOSIS — R222 Localized swelling, mass and lump, trunk: Secondary | ICD-10-CM

## 2019-07-31 DIAGNOSIS — Z8249 Family history of ischemic heart disease and other diseases of the circulatory system: Secondary | ICD-10-CM | POA: Diagnosis not present

## 2019-07-31 DIAGNOSIS — J948 Other specified pleural conditions: Secondary | ICD-10-CM | POA: Diagnosis not present

## 2019-07-31 DIAGNOSIS — R918 Other nonspecific abnormal finding of lung field: Secondary | ICD-10-CM | POA: Diagnosis present

## 2019-07-31 DIAGNOSIS — Z85828 Personal history of other malignant neoplasm of skin: Secondary | ICD-10-CM | POA: Diagnosis not present

## 2019-07-31 DIAGNOSIS — Z4682 Encounter for fitting and adjustment of non-vascular catheter: Secondary | ICD-10-CM | POA: Diagnosis not present

## 2019-07-31 DIAGNOSIS — M8580 Other specified disorders of bone density and structure, unspecified site: Secondary | ICD-10-CM | POA: Diagnosis present

## 2019-07-31 DIAGNOSIS — Z83438 Family history of other disorder of lipoprotein metabolism and other lipidemia: Secondary | ICD-10-CM | POA: Diagnosis not present

## 2019-07-31 DIAGNOSIS — E785 Hyperlipidemia, unspecified: Secondary | ICD-10-CM | POA: Diagnosis present

## 2019-07-31 DIAGNOSIS — Z823 Family history of stroke: Secondary | ICD-10-CM | POA: Diagnosis not present

## 2019-07-31 DIAGNOSIS — Z20822 Contact with and (suspected) exposure to covid-19: Secondary | ICD-10-CM | POA: Diagnosis present

## 2019-07-31 DIAGNOSIS — D62 Acute posthemorrhagic anemia: Secondary | ICD-10-CM | POA: Diagnosis not present

## 2019-07-31 DIAGNOSIS — Z801 Family history of malignant neoplasm of trachea, bronchus and lung: Secondary | ICD-10-CM | POA: Diagnosis not present

## 2019-07-31 DIAGNOSIS — J939 Pneumothorax, unspecified: Secondary | ICD-10-CM | POA: Diagnosis not present

## 2019-07-31 LAB — COMPREHENSIVE METABOLIC PANEL
ALT: 20 U/L (ref 0–44)
AST: 24 U/L (ref 15–41)
Albumin: 4.1 g/dL (ref 3.5–5.0)
Alkaline Phosphatase: 51 U/L (ref 38–126)
Anion gap: 12 (ref 5–15)
BUN: 20 mg/dL (ref 8–23)
CO2: 22 mmol/L (ref 22–32)
Calcium: 9.3 mg/dL (ref 8.9–10.3)
Chloride: 105 mmol/L (ref 98–111)
Creatinine, Ser: 0.85 mg/dL (ref 0.44–1.00)
GFR calc Af Amer: >60 mL/min (ref >60)
GFR calc non Af Amer: >60 mL/min (ref >60)
Glucose, Bld: 118 mg/dL — ABNORMAL HIGH (ref 70–99)
Potassium: 3.6 mmol/L (ref 3.5–5.1)
Sodium: 139 mmol/L (ref 135–145)
Total Bilirubin: 0.9 mg/dL (ref 0.3–1.2)
Total Protein: 6.6 g/dL (ref 6.5–8.1)

## 2019-07-31 LAB — URINALYSIS, ROUTINE W REFLEX MICROSCOPIC
Bilirubin Urine: NEGATIVE
Glucose, UA: NEGATIVE mg/dL
Hgb urine dipstick: NEGATIVE
Ketones, ur: NEGATIVE mg/dL
Leukocytes,Ua: NEGATIVE
Nitrite: NEGATIVE
Protein, ur: NEGATIVE mg/dL
Specific Gravity, Urine: 1.021 (ref 1.005–1.030)
pH: 5 (ref 5.0–8.0)

## 2019-07-31 LAB — BLOOD GAS, ARTERIAL
Acid-Base Excess: 1.9 mmol/L (ref 0.0–2.0)
Bicarbonate: 25.8 mmol/L (ref 20.0–28.0)
Drawn by: 421801
FIO2: 21
O2 Saturation: 99.1 %
Patient temperature: 37
pCO2 arterial: 39.6 mmHg (ref 32.0–48.0)
pH, Arterial: 7.43 (ref 7.350–7.450)
pO2, Arterial: 155 mmHg — ABNORMAL HIGH (ref 83.0–108.0)

## 2019-07-31 LAB — SURGICAL PCR SCREEN
MRSA, PCR: NEGATIVE
Staphylococcus aureus: NEGATIVE

## 2019-07-31 LAB — APTT: aPTT: 34 seconds (ref 24–36)

## 2019-07-31 LAB — CBC
HCT: 39.2 % (ref 36.0–46.0)
Hemoglobin: 12.6 g/dL (ref 12.0–15.0)
MCH: 29.8 pg (ref 26.0–34.0)
MCHC: 32.1 g/dL (ref 30.0–36.0)
MCV: 92.7 fL (ref 80.0–100.0)
Platelets: 255 10*3/uL (ref 150–400)
RBC: 4.23 MIL/uL (ref 3.87–5.11)
RDW: 12.9 % (ref 11.5–15.5)
WBC: 6.1 10*3/uL (ref 4.0–10.5)
nRBC: 0 % (ref 0.0–0.2)

## 2019-07-31 LAB — PROTIME-INR
INR: 0.9 (ref 0.8–1.2)
Prothrombin Time: 12.2 seconds (ref 11.4–15.2)

## 2019-07-31 LAB — TYPE AND SCREEN
ABO/RH(D): A NEG
Antibody Screen: NEGATIVE

## 2019-07-31 LAB — ABO/RH: ABO/RH(D): A NEG

## 2019-07-31 LAB — SARS CORONAVIRUS 2 (TAT 6-24 HRS): SARS Coronavirus 2: NEGATIVE

## 2019-07-31 NOTE — Progress Notes (Signed)
PCP - Dr. Osborne Casco @ [facility]  Chest x-ray - 07/31/19 EKG - 07/31/19 Stress Test - Denies ECHO - Denies Cardiac Cath - Denies  Sleep Study - Denies  DM - Denies  COVID TEST- 07/31/19  Anesthesia review: No  Patient denies shortness of breath, fever, cough and chest pain at PAT appointment   All instructions explained to the patient, with a verbal understanding of the material. Patient agrees to go over the instructions while at home for a better understanding. Patient also instructed to self quarantine after being tested for COVID-19. The opportunity to ask questions was provided.

## 2019-08-01 NOTE — Anesthesia Preprocedure Evaluation (Addendum)
Anesthesia Evaluation  Patient identified by MRN, date of birth, ID band Patient awake    Reviewed: Allergy & Precautions, H&P , NPO status , Patient's Chart, lab work & pertinent test results  Airway Mallampati: III  TM Distance: >3 FB Neck ROM: Full    Dental no notable dental hx. (+) Teeth Intact, Dental Advisory Given   Pulmonary neg pulmonary ROS, former smoker,    Pulmonary exam normal breath sounds clear to auscultation       Cardiovascular Exercise Tolerance: Good negative cardio ROS   Rhythm:Regular Rate:Normal     Neuro/Psych negative neurological ROS  negative psych ROS   GI/Hepatic negative GI ROS, Neg liver ROS,   Endo/Other  negative endocrine ROS  Renal/GU negative Renal ROS  negative genitourinary   Musculoskeletal   Abdominal   Peds  Hematology negative hematology ROS (+)   Anesthesia Other Findings   Reproductive/Obstetrics negative OB ROS                            Anesthesia Physical Anesthesia Plan  ASA: II  Anesthesia Plan: General   Post-op Pain Management:    Induction: Intravenous  PONV Risk Score and Plan: 4 or greater and Ondansetron, Dexamethasone and Midazolam  Airway Management Planned: Double Lumen EBT  Additional Equipment: Arterial line and CVP  Intra-op Plan:   Post-operative Plan: Extubation in OR  Informed Consent: I have reviewed the patients History and Physical, chart, labs and discussed the procedure including the risks, benefits and alternatives for the proposed anesthesia with the patient or authorized representative who has indicated his/her understanding and acceptance.     Dental advisory given  Plan Discussed with: CRNA  Anesthesia Plan Comments:         Anesthesia Quick Evaluation

## 2019-08-02 ENCOUNTER — Inpatient Hospital Stay (HOSPITAL_COMMUNITY): Payer: Medicare PPO | Admitting: Anesthesiology

## 2019-08-02 ENCOUNTER — Inpatient Hospital Stay (HOSPITAL_COMMUNITY): Payer: Medicare PPO

## 2019-08-02 ENCOUNTER — Other Ambulatory Visit: Payer: Self-pay

## 2019-08-02 ENCOUNTER — Inpatient Hospital Stay (HOSPITAL_COMMUNITY)
Admission: RE | Admit: 2019-08-02 | Discharge: 2019-08-03 | DRG: 164 | Disposition: A | Discharge status: Home / Self Care | Payer: Medicare PPO | Source: Ambulatory Visit | Attending: Cardiothoracic Surgery | Admitting: Cardiothoracic Surgery

## 2019-08-02 ENCOUNTER — Encounter (HOSPITAL_COMMUNITY)
Admission: RE | Disposition: A | Discharge status: Home / Self Care | Payer: Self-pay | Source: Home / Self Care | Attending: Cardiothoracic Surgery

## 2019-08-02 ENCOUNTER — Encounter (HOSPITAL_COMMUNITY): Payer: Self-pay | Admitting: Cardiothoracic Surgery

## 2019-08-02 DIAGNOSIS — D491 Neoplasm of unspecified behavior of respiratory system: Secondary | ICD-10-CM | POA: Diagnosis not present

## 2019-08-02 DIAGNOSIS — R222 Localized swelling, mass and lump, trunk: Secondary | ICD-10-CM

## 2019-08-02 DIAGNOSIS — Z20822 Contact with and (suspected) exposure to covid-19: Secondary | ICD-10-CM | POA: Diagnosis present

## 2019-08-02 DIAGNOSIS — Z83438 Family history of other disorder of lipoprotein metabolism and other lipidemia: Secondary | ICD-10-CM | POA: Diagnosis not present

## 2019-08-02 DIAGNOSIS — E785 Hyperlipidemia, unspecified: Secondary | ICD-10-CM | POA: Diagnosis not present

## 2019-08-02 DIAGNOSIS — R918 Other nonspecific abnormal finding of lung field: Secondary | ICD-10-CM | POA: Diagnosis present

## 2019-08-02 DIAGNOSIS — Z8249 Family history of ischemic heart disease and other diseases of the circulatory system: Secondary | ICD-10-CM

## 2019-08-02 DIAGNOSIS — Z823 Family history of stroke: Secondary | ICD-10-CM | POA: Diagnosis not present

## 2019-08-02 DIAGNOSIS — M8580 Other specified disorders of bone density and structure, unspecified site: Secondary | ICD-10-CM | POA: Diagnosis present

## 2019-08-02 DIAGNOSIS — Z9889 Other specified postprocedural states: Secondary | ICD-10-CM

## 2019-08-02 DIAGNOSIS — Z85828 Personal history of other malignant neoplasm of skin: Secondary | ICD-10-CM | POA: Diagnosis not present

## 2019-08-02 DIAGNOSIS — D62 Acute posthemorrhagic anemia: Secondary | ICD-10-CM | POA: Diagnosis not present

## 2019-08-02 DIAGNOSIS — Z801 Family history of malignant neoplasm of trachea, bronchus and lung: Secondary | ICD-10-CM

## 2019-08-02 DIAGNOSIS — Z87891 Personal history of nicotine dependence: Secondary | ICD-10-CM | POA: Diagnosis not present

## 2019-08-02 DIAGNOSIS — L739 Follicular disorder, unspecified: Secondary | ICD-10-CM | POA: Diagnosis present

## 2019-08-02 HISTORY — PX: THORACOSCOPY: SUR1347

## 2019-08-02 HISTORY — PX: INTERCOSTAL NERVE BLOCK: SHX5021

## 2019-08-02 LAB — GLUCOSE, CAPILLARY
Glucose-Capillary: 150 mg/dL — ABNORMAL HIGH (ref 70–99)
Glucose-Capillary: 155 mg/dL — ABNORMAL HIGH (ref 70–99)

## 2019-08-02 SURGERY — LOBECTOMY, LUNG, ROBOT-ASSISTED, USING VATS
Anesthesia: General | Site: Chest | Laterality: Right

## 2019-08-02 MED ORDER — KETOROLAC TROMETHAMINE 15 MG/ML IJ SOLN
15.0000 mg | Freq: Four times a day (QID) | INTRAMUSCULAR | Status: DC
  Administered 2019-08-02 – 2019-08-03 (×5): 15 mg via INTRAVENOUS
  Filled 2019-08-02 (×5): qty 1

## 2019-08-02 MED ORDER — 0.9 % SODIUM CHLORIDE (POUR BTL) OPTIME
TOPICAL | Status: DC | PRN
  Administered 2019-08-02: 2000 mL

## 2019-08-02 MED ORDER — ONDANSETRON HCL 4 MG/2ML IJ SOLN
INTRAMUSCULAR | Status: AC
  Filled 2019-08-02: qty 2

## 2019-08-02 MED ORDER — ACETAMINOPHEN 500 MG PO TABS
1000.0000 mg | ORAL_TABLET | Freq: Four times a day (QID) | ORAL | Status: DC
  Administered 2019-08-02 – 2019-08-03 (×5): 1000 mg via ORAL
  Filled 2019-08-02 (×5): qty 2

## 2019-08-02 MED ORDER — HYDROMORPHONE HCL 1 MG/ML IJ SOLN
INTRAMUSCULAR | Status: AC
  Filled 2019-08-02: qty 1

## 2019-08-02 MED ORDER — PHENYLEPHRINE HCL-NACL 10-0.9 MG/250ML-% IV SOLN
INTRAVENOUS | Status: DC | PRN
  Administered 2019-08-02: 30 ug/min via INTRAVENOUS

## 2019-08-02 MED ORDER — BISACODYL 5 MG PO TBEC
10.0000 mg | DELAYED_RELEASE_TABLET | Freq: Every day | ORAL | Status: DC
  Administered 2019-08-02 – 2019-08-03 (×2): 10 mg via ORAL
  Filled 2019-08-02 (×2): qty 2

## 2019-08-02 MED ORDER — ONDANSETRON HCL 4 MG/2ML IJ SOLN
INTRAMUSCULAR | Status: DC | PRN
  Administered 2019-08-02: 4 mg via INTRAVENOUS

## 2019-08-02 MED ORDER — MORPHINE SULFATE (PF) 2 MG/ML IV SOLN: 1.0000 mg | INTRAVENOUS | Status: DC | PRN

## 2019-08-02 MED ORDER — ROSUVASTATIN CALCIUM 5 MG PO TABS
10.0000 mg | ORAL_TABLET | Freq: Every day | ORAL | Status: DC
  Administered 2019-08-03: 10 mg via ORAL
  Filled 2019-08-02: qty 2

## 2019-08-02 MED ORDER — ORAL CARE MOUTH RINSE: 15.0000 mL | Freq: Once | OROMUCOSAL | Status: AC

## 2019-08-02 MED ORDER — CHLORHEXIDINE GLUCONATE 0.12 % MT SOLN
15.0000 mL | Freq: Once | OROMUCOSAL | Status: AC
  Administered 2019-08-02: 15 mL via OROMUCOSAL
  Filled 2019-08-02: qty 15

## 2019-08-02 MED ORDER — MIDAZOLAM HCL 5 MG/5ML IJ SOLN
INTRAMUSCULAR | Status: DC | PRN
  Administered 2019-08-02: 2 mg via INTRAVENOUS

## 2019-08-02 MED ORDER — DEXAMETHASONE SODIUM PHOSPHATE 10 MG/ML IJ SOLN
INTRAMUSCULAR | Status: AC
  Filled 2019-08-02: qty 1

## 2019-08-02 MED ORDER — PROPOFOL 10 MG/ML IV BOLUS
INTRAVENOUS | Status: AC
  Filled 2019-08-02: qty 20

## 2019-08-02 MED ORDER — SODIUM CHLORIDE FLUSH 0.9 % IV SOLN
INTRAVENOUS | Status: DC | PRN
  Administered 2019-08-02: 68 mL

## 2019-08-02 MED ORDER — ENOXAPARIN SODIUM 40 MG/0.4ML ~~LOC~~ SOLN
40.0000 mg | Freq: Every day | SUBCUTANEOUS | Status: DC
  Administered 2019-08-02: 40 mg via SUBCUTANEOUS
  Filled 2019-08-02: qty 0.4

## 2019-08-02 MED ORDER — SODIUM CHLORIDE 0.9 % IR SOLN
Status: DC | PRN
  Administered 2019-08-02: 1000 mL

## 2019-08-02 MED ORDER — FENTANYL CITRATE (PF) 100 MCG/2ML IJ SOLN
INTRAMUSCULAR | Status: DC | PRN
  Administered 2019-08-02 (×5): 50 ug via INTRAVENOUS

## 2019-08-02 MED ORDER — ONDANSETRON HCL 4 MG/2ML IJ SOLN: 4.0000 mg | Freq: Four times a day (QID) | INTRAMUSCULAR | Status: DC | PRN

## 2019-08-02 MED ORDER — ROCURONIUM BROMIDE 10 MG/ML (PF) SYRINGE
PREFILLED_SYRINGE | INTRAVENOUS | Status: AC
  Filled 2019-08-02: qty 10

## 2019-08-02 MED ORDER — HYDROMORPHONE HCL 1 MG/ML IJ SOLN
0.2500 mg | INTRAMUSCULAR | Status: DC | PRN
  Administered 2019-08-02 (×2): 0.5 mg via INTRAVENOUS

## 2019-08-02 MED ORDER — SENNOSIDES-DOCUSATE SODIUM 8.6-50 MG PO TABS
1.0000 | ORAL_TABLET | Freq: Every day | ORAL | Status: DC
  Administered 2019-08-02: 1 via ORAL
  Filled 2019-08-02: qty 1

## 2019-08-02 MED ORDER — SUGAMMADEX SODIUM 200 MG/2ML IV SOLN
INTRAVENOUS | Status: DC | PRN
  Administered 2019-08-02: 200 mg via INTRAVENOUS

## 2019-08-02 MED ORDER — BUPIVACAINE LIPOSOME 1.3 % IJ SUSP
20.0000 mL | Freq: Once | INTRAMUSCULAR | Status: DC
  Filled 2019-08-02: qty 20

## 2019-08-02 MED ORDER — FENTANYL CITRATE (PF) 250 MCG/5ML IJ SOLN
INTRAMUSCULAR | Status: AC
  Filled 2019-08-02: qty 5

## 2019-08-02 MED ORDER — PROPOFOL 10 MG/ML IV BOLUS
INTRAVENOUS | Status: DC | PRN
  Administered 2019-08-02: 120 mg via INTRAVENOUS
  Administered 2019-08-02: 30 mg via INTRAVENOUS

## 2019-08-02 MED ORDER — CEFAZOLIN SODIUM-DEXTROSE 2-4 GM/100ML-% IV SOLN
2.0000 g | INTRAVENOUS | Status: AC
  Administered 2019-08-02: 2 g via INTRAVENOUS
  Filled 2019-08-02: qty 100

## 2019-08-02 MED ORDER — CEFAZOLIN SODIUM-DEXTROSE 2-4 GM/100ML-% IV SOLN
2.0000 g | Freq: Three times a day (TID) | INTRAVENOUS | Status: AC
  Administered 2019-08-02 (×2): 2 g via INTRAVENOUS
  Filled 2019-08-02 (×2): qty 100

## 2019-08-02 MED ORDER — TRAMADOL HCL 50 MG PO TABS
50.0000 mg | ORAL_TABLET | Freq: Four times a day (QID) | ORAL | Status: DC | PRN
  Administered 2019-08-02 – 2019-08-03 (×3): 100 mg via ORAL
  Filled 2019-08-02 (×4): qty 2

## 2019-08-02 MED ORDER — CHLORHEXIDINE GLUCONATE CLOTH 2 % EX PADS
6.0000 | MEDICATED_PAD | Freq: Every day | CUTANEOUS | Status: DC
  Administered 2019-08-02 – 2019-08-03 (×2): 6 via TOPICAL

## 2019-08-02 MED ORDER — ACETAMINOPHEN 500 MG PO TABS
1000.0000 mg | ORAL_TABLET | Freq: Once | ORAL | Status: AC
  Administered 2019-08-02: 1000 mg via ORAL
  Filled 2019-08-02: qty 2

## 2019-08-02 MED ORDER — DEXAMETHASONE SODIUM PHOSPHATE 10 MG/ML IJ SOLN
INTRAMUSCULAR | Status: DC | PRN
  Administered 2019-08-02: 10 mg via INTRAVENOUS

## 2019-08-02 MED ORDER — LACTATED RINGERS IV SOLN: INTRAVENOUS | Status: DC | PRN

## 2019-08-02 MED ORDER — MIDAZOLAM HCL 2 MG/2ML IJ SOLN
INTRAMUSCULAR | Status: AC
  Filled 2019-08-02: qty 2

## 2019-08-02 MED ORDER — SODIUM CHLORIDE 0.9 % IV SOLN: INTRAVENOUS | Status: DC | PRN

## 2019-08-02 MED ORDER — LIDOCAINE 2% (20 MG/ML) 5 ML SYRINGE
INTRAMUSCULAR | Status: AC
  Filled 2019-08-02: qty 5

## 2019-08-02 MED ORDER — ACETAMINOPHEN 160 MG/5ML PO SOLN: 1000.0000 mg | Freq: Four times a day (QID) | ORAL | Status: DC

## 2019-08-02 MED ORDER — BUPIVACAINE HCL (PF) 0.5 % IJ SOLN
INTRAMUSCULAR | Status: AC
  Filled 2019-08-02: qty 30

## 2019-08-02 MED ORDER — ROCURONIUM BROMIDE 10 MG/ML (PF) SYRINGE
PREFILLED_SYRINGE | INTRAVENOUS | Status: DC | PRN
  Administered 2019-08-02: 80 mg via INTRAVENOUS

## 2019-08-02 MED ORDER — INSULIN ASPART 100 UNIT/ML ~~LOC~~ SOLN
0.0000 [IU] | Freq: Three times a day (TID) | SUBCUTANEOUS | Status: DC
  Administered 2019-08-02: 2 [IU] via SUBCUTANEOUS

## 2019-08-02 SURGICAL SUPPLY — 125 items
ADAPTER VALVE BIOPSY EBUS (MISCELLANEOUS) IMPLANT
ADH SKN CLS APL DERMABOND .7 (GAUZE/BANDAGES/DRESSINGS) ×2
ADPTR VALVE BIOPSY EBUS (MISCELLANEOUS)
ANCHOR CATH FOLEY SECURE (MISCELLANEOUS) ×3 IMPLANT
APL SRG 22X2 LUM MLBL SLNT (VASCULAR PRODUCTS)
APPLICATOR TIP EXT COSEAL (VASCULAR PRODUCTS) IMPLANT
BAG TISS RTRVL C300 12X14 (MISCELLANEOUS) ×2
BLADE CLIPPER SURG (BLADE) ×1 IMPLANT
BNDG COHESIVE 3X5 TAN STRL LF (GAUZE/BANDAGES/DRESSINGS) IMPLANT
BNDG COHESIVE 6X5 TAN STRL LF (GAUZE/BANDAGES/DRESSINGS) ×3 IMPLANT
BRUSH CYTOL CELLEBRITY 1.5X140 (MISCELLANEOUS) IMPLANT
CANISTER SUCT 3000ML PPV (MISCELLANEOUS) ×6 IMPLANT
CANNULA REDUC XI 12-8 STAPL (CANNULA) ×6
CANNULA REDUCER 12-8 DVNC XI (CANNULA) ×4 IMPLANT
CATH THORACIC 28FR (CATHETERS) IMPLANT
CATH THORACIC 36FR (CATHETERS) IMPLANT
CATH THORACIC 36FR RT ANG (CATHETERS) IMPLANT
CLIP VESOCCLUDE MED 6/CT (CLIP) IMPLANT
CNTNR URN SCR LID CUP LEK RST (MISCELLANEOUS) ×10 IMPLANT
CONN ST 1/4X3/8  BEN (MISCELLANEOUS)
CONN ST 1/4X3/8 BEN (MISCELLANEOUS) IMPLANT
CONN Y 3/8X3/8X3/8  BEN (MISCELLANEOUS)
CONN Y 3/8X3/8X3/8 BEN (MISCELLANEOUS) IMPLANT
CONT SPEC 4OZ STRL OR WHT (MISCELLANEOUS) ×15
COVER BACK TABLE 60X90IN (DRAPES) ×1 IMPLANT
DEFOGGER SCOPE WARMER CLEARIFY (MISCELLANEOUS) ×3 IMPLANT
DERMABOND ADVANCED (GAUZE/BANDAGES/DRESSINGS) ×1
DERMABOND ADVANCED .7 DNX12 (GAUZE/BANDAGES/DRESSINGS) ×3 IMPLANT
DISSECTOR BLUNT TIP ENDO 5MM (MISCELLANEOUS) IMPLANT
DRAIN CHANNEL 28F RND 3/8 FF (WOUND CARE) ×2 IMPLANT
DRAPE ARM DVNC X/XI (DISPOSABLE) ×8 IMPLANT
DRAPE COLUMN DVNC XI (DISPOSABLE) ×2 IMPLANT
DRAPE CV SPLIT W-CLR ANES SCRN (DRAPES) ×3 IMPLANT
DRAPE DA VINCI XI ARM (DISPOSABLE) ×12
DRAPE DA VINCI XI COLUMN (DISPOSABLE) ×3
DRAPE ORTHO SPLIT 77X108 STRL (DRAPES) ×3
DRAPE SURG ORHT 6 SPLT 77X108 (DRAPES) ×2 IMPLANT
DRAPE WARM FLUID 44X44 (DRAPES) ×3 IMPLANT
ELECT BLADE 4.0 EZ CLEAN MEGAD (MISCELLANEOUS) ×3
ELECT REM PT RETURN 9FT ADLT (ELECTROSURGICAL) ×3
ELECTRODE BLDE 4.0 EZ CLN MEGD (MISCELLANEOUS) ×2 IMPLANT
ELECTRODE REM PT RTRN 9FT ADLT (ELECTROSURGICAL) ×2 IMPLANT
FILTER SMOKE EVAC ULPA (FILTER) ×1 IMPLANT
FORCEPS BIOP RJ4 1.8 (CUTTING FORCEPS) IMPLANT
GAUZE KITTNER 4X8 (MISCELLANEOUS) ×3 IMPLANT
GAUZE SPONGE 4X4 12PLY STRL (GAUZE/BANDAGES/DRESSINGS) ×3 IMPLANT
GLOVE BIO SURGEON STRL SZ 6.5 (GLOVE) ×6 IMPLANT
GLOVE SURG SS PI 6.0 STRL IVOR (GLOVE) ×2 IMPLANT
GOWN STRL REUS W/ TWL LRG LVL3 (GOWN DISPOSABLE) ×6 IMPLANT
GOWN STRL REUS W/TWL 2XL LVL3 (GOWN DISPOSABLE) ×3 IMPLANT
GOWN STRL REUS W/TWL LRG LVL3 (GOWN DISPOSABLE) ×9
HEMOSTAT SURGICEL 2X14 (HEMOSTASIS) ×9 IMPLANT
IRRIGATION STRYKERFLOW (MISCELLANEOUS) ×2 IMPLANT
IRRIGATOR STRYKERFLOW (MISCELLANEOUS) ×3
KIT BASIN OR (CUSTOM PROCEDURE TRAY) ×3 IMPLANT
KIT CLEAN ENDO COMPLIANCE (KITS) ×3 IMPLANT
KIT TURNOVER KIT B (KITS) ×3 IMPLANT
MARKER SKIN DUAL TIP RULER LAB (MISCELLANEOUS) IMPLANT
NS IRRIG 1000ML POUR BTL (IV SOLUTION) ×5 IMPLANT
OBTURATOR OPTICAL STANDARD 8MM (TROCAR) ×6
OBTURATOR OPTICAL STND 8 DVNC (TROCAR) ×4
OBTURATOR OPTICALSTD 8 DVNC (TROCAR) ×2 IMPLANT
OIL SILICONE PENTAX (PARTS (SERVICE/REPAIRS)) ×3 IMPLANT
PACK CHEST (CUSTOM PROCEDURE TRAY) ×3 IMPLANT
PAD ARMBOARD 7.5X6 YLW CONV (MISCELLANEOUS) ×15 IMPLANT
PASSER SUT SWANSON 36MM LOOP (INSTRUMENTS) IMPLANT
PENCIL SMOKE EVACUATOR (MISCELLANEOUS) ×1 IMPLANT
RELOAD STAPLE 45 3.5 BLU DVNC (STAPLE) IMPLANT
RELOAD STAPLER 3.5X45 BLU DVNC (STAPLE) ×4 IMPLANT
SEAL CANN UNIV 5-8 DVNC XI (MISCELLANEOUS) ×5 IMPLANT
SEAL XI 5MM-8MM UNIVERSAL (MISCELLANEOUS) ×9
SEALANT PROGEL (MISCELLANEOUS) IMPLANT
SEALANT SURG COSEAL 4ML (VASCULAR PRODUCTS) IMPLANT
SEALANT SURG COSEAL 8ML (VASCULAR PRODUCTS) IMPLANT
SEALER LIGASURE MARYLAND 30 (ELECTROSURGICAL) IMPLANT
SET TUBE SMOKE EVAC HIGH FLOW (TUBING) ×3 IMPLANT
SLEEVE SUCTION 125 (MISCELLANEOUS) ×3 IMPLANT
SOL ANTI FOG 6CC (MISCELLANEOUS) IMPLANT
SOLUTION ANTI FOG 6CC (MISCELLANEOUS)
SOLUTION ELECTROLUBE (MISCELLANEOUS) ×3 IMPLANT
STAPLER 45 SUREFORM CVD (STAPLE) ×3
STAPLER 45 SUREFORM CVD DVNC (STAPLE) ×1 IMPLANT
STAPLER CANNULA SEAL DVNC XI (STAPLE) ×4 IMPLANT
STAPLER CANNULA SEAL XI (STAPLE) ×6
STAPLER RELOAD 3.5X45 BLU DVNC (STAPLE) ×4
STAPLER RELOAD 3.5X45 BLUE (STAPLE) ×6
STOPCOCK 4 WAY LG BORE MALE ST (IV SETS) ×3 IMPLANT
SUT PROLENE 3 0 SH DA (SUTURE) IMPLANT
SUT PROLENE 4 0 RB 1 (SUTURE)
SUT PROLENE 4-0 RB1 .5 CRCL 36 (SUTURE) IMPLANT
SUT SILK  1 MH (SUTURE) ×3
SUT SILK 1 MH (SUTURE) ×3 IMPLANT
SUT SILK 1 TIES 10X30 (SUTURE) IMPLANT
SUT SILK 2 0SH CR/8 30 (SUTURE) IMPLANT
SUT VIC AB 1 CTX 18 (SUTURE) IMPLANT
SUT VIC AB 1 CTX 36 (SUTURE)
SUT VIC AB 1 CTX36XBRD ANBCTR (SUTURE) IMPLANT
SUT VIC AB 2-0 CTX 36 (SUTURE) ×1 IMPLANT
SUT VIC AB 3-0 X1 27 (SUTURE) ×6 IMPLANT
SUT VICRYL 0 TIES 12 18 (SUTURE) ×3 IMPLANT
SUT VICRYL 0 UR6 27IN ABS (SUTURE) ×10 IMPLANT
SUT VICRYL 2 TP 1 (SUTURE) IMPLANT
SYR 20ML ECCENTRIC (SYRINGE) ×3 IMPLANT
SYR 20ML LL LF (SYRINGE) ×3 IMPLANT
SYR 50ML LL SCALE MARK (SYRINGE) ×3 IMPLANT
SYR 5ML LL (SYRINGE) ×3 IMPLANT
SYSTEM RETRIEVAL ANCHOR 12 (MISCELLANEOUS) ×2 IMPLANT
SYSTEM SAHARA CHEST DRAIN ATS (WOUND CARE) ×3 IMPLANT
TAPE CLOTH 4X10 WHT NS (GAUZE/BANDAGES/DRESSINGS) ×3 IMPLANT
TAPE CLOTH SURG 4X10 WHT LF (GAUZE/BANDAGES/DRESSINGS) ×2 IMPLANT
TAPE UMBILICAL COTTON 1/8X30 (MISCELLANEOUS) IMPLANT
TIP APPLICATOR SPRAY EXTEND 16 (VASCULAR PRODUCTS) IMPLANT
TOWEL GREEN STERILE (TOWEL DISPOSABLE) ×6 IMPLANT
TOWEL GREEN STERILE FF (TOWEL DISPOSABLE) ×3 IMPLANT
TRAP FLUID SMOKE EVACUATOR (MISCELLANEOUS) ×1 IMPLANT
TRAP SPECIMEN MUCUS 40CC (MISCELLANEOUS) IMPLANT
TRAY FOLEY MTR SLVR 16FR STAT (SET/KITS/TRAYS/PACK) ×3 IMPLANT
TROCAR XCEL 12X100 BLDLESS (ENDOMECHANICALS) ×3 IMPLANT
TROCAR XCEL BLADELESS 5X75MML (TROCAR) IMPLANT
TUBE CONNECTING 20X1/4 (TUBING) ×3 IMPLANT
TUBING EXTENTION W/L.L. (IV SETS) ×3 IMPLANT
VALVE BIOPSY  SINGLE USE (MISCELLANEOUS) ×3
VALVE BIOPSY SINGLE USE (MISCELLANEOUS) ×2 IMPLANT
VALVE SUCTION BRONCHIO DISP (MISCELLANEOUS) ×3 IMPLANT
WATER STERILE IRR 1000ML POUR (IV SOLUTION) ×5 IMPLANT

## 2019-08-02 NOTE — Transfer of Care (Signed)
Immediate Anesthesia Transfer of Care Note  Patient: Beth Dean  Procedure(s) Performed: XI ROBOTIC ASSISTED THORASCOPY-RESECTION OF FIBEROUS TUMOR OF THE CHEST (Right Chest) Intercostal Nerve Block (Right Chest)  Patient Location: PACU  Anesthesia Type:General  Level of Consciousness: awake, alert  and oriented  Airway & Oxygen Therapy: Patient Spontanous Breathing and Patient connected to nasal cannula oxygen  Post-op Assessment: Report given to RN, Post -op Vital signs reviewed and stable and Patient moving all extremities X 4  Post vital signs: Reviewed and stable  Last Vitals:  Vitals Value Taken Time  BP 127/68 08/02/19 1005  Temp    Pulse 62 08/02/19 1007  Resp 14 08/02/19 1007  SpO2 99 % 08/02/19 1007  Vitals shown include unvalidated device data.  Last Pain:  Vitals:   08/02/19 0602  TempSrc:   PainSc: 0-No pain      Patients Stated Pain Goal: 3 ([id number])  Complications: No apparent anesthesia complications

## 2019-08-02 NOTE — Anesthesia Procedure Notes (Signed)
Arterial Line Insertion Start/End5/26/2021 7:05 AM, 08/02/2019 7:15 AM Performed by: Kyung Rudd, CRNA, CRNA  Patient location: Pre-op. Preanesthetic checklist: patient identified, IV checked, site marked, risks and benefits discussed, surgical consent, monitors and equipment checked, pre-op evaluation and timeout performed Lidocaine 1% used for infiltration and patient sedated Left, radial was placed Catheter size: 20 G Hand hygiene performed , maximum sterile barriers used  and Seldinger technique used Allen's test indicative of satisfactory collateral circulation Attempts: 1 Procedure performed without using ultrasound guided technique. Following insertion, Biopatch and dressing applied. Post procedure assessment: normal  Patient tolerated the procedure well with no immediate complications.

## 2019-08-02 NOTE — Anesthesia Postprocedure Evaluation (Signed)
Anesthesia Post Note  Patient: MARYBELLA CRAN  Procedure(s) Performed: XI ROBOTIC ASSISTED THORASCOPY-RESECTION OF FIBEROUS TUMOR OF THE CHEST (Right Chest) Intercostal Nerve Block (Right Chest)     Patient location during evaluation: PACU Anesthesia Type: General Level of consciousness: awake and alert Pain management: pain level controlled Vital Signs Assessment: post-procedure vital signs reviewed and stable Respiratory status: spontaneous breathing, nonlabored ventilation and respiratory function stable Cardiovascular status: blood pressure returned to baseline and stable Postop Assessment: no apparent nausea or vomiting Anesthetic complications: no    Last Vitals:  Vitals:   08/02/19 1135 08/02/19 1205  BP: 119/60 (!) 112/47  Pulse: 64 60  Resp: 20 19  Temp:    SpO2: 93% 95%    Last Pain:  Vitals:   08/02/19 1205  TempSrc:   PainSc: Asleep                 Rei Medlen,W. EDMOND

## 2019-08-02 NOTE — Op Note (Signed)
      [address]       [place] [address]             [phone]      08/02/2019  10:13 AM  PATIENT:  Beth Dean  69 y.o. 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   POST-OPERATIVE DIAGNOSIS:  Same   PROCEDURE:  Procedure(s): XI ROBOTIC ASSISTED THORASCOPY-RESECTION OF FIBEROUS TUMOR OF THE CHEST (Right) Intercostal Nerve Block (Right)  SURGEON:  Surgeon(s) and Role:    * [place] Isaac, MD - Primary    * Lightfoot, Lucile Crater, MD - Assisting  PHYSICIAN ASSISTANT:   Nicholes Rough, PA-C  ANESTHESIA:   general  EBL:  25 mL   BLOOD ADMINISTERED:none  DRAINS: ONE BLAKE DRAIN   LOCAL MEDICATIONS USED:  BUPIVICAINE   SPECIMEN:  Source of Specimen:  RIGHT LUNG MASS  DISPOSITION OF SPECIMEN:  PATHOLOGY  COUNTS:  YES  DICTATION: .Dragon Dictation  PLAN OF CARE: Admit to inpatient   PATIENT DISPOSITION:  ICU - intubated and hemodynamically stable.   Delay start of Pharmacological VTE agent (>24hrs) due to surgical blood loss or risk of bleeding: yes

## 2019-08-02 NOTE — Anesthesia Procedure Notes (Signed)
Procedure Name: Intubation Date/Time: 08/02/2019 7:47 AM Performed by: Kyung Rudd, CRNA Pre-anesthesia Checklist: Patient identified, Emergency Drugs available, Suction available and Patient being monitored Patient Re-evaluated:Patient Re-evaluated prior to induction Oxygen Delivery Method: Circle system utilized Preoxygenation: Pre-oxygenation with 100% oxygen Induction Type: IV induction Ventilation: Mask ventilation without difficulty Laryngoscope Size: Glidescope and 4 Tube type: Oral Endobronchial tube: Double lumen EBT and 37 Fr Number of attempts: 2 Placement Confirmation: ETT inserted through vocal cords under direct vision,  positive ETCO2 and breath sounds checked- equal and bilateral Tube secured with: Tape Dental Injury: Teeth and Oropharynx as per pre-operative assessment

## 2019-08-02 NOTE — H&P (Signed)
[address]       [place] [address]             [phone]                    Beth Dean [facility] Medical Record [id number] Date of Birth: Mar 02, 1951  Referring: Haywood Pao, MD Primary Care: Tisovec, Fransico Him, MD Primary Cardiologist: No primary care provider on file.  Chief Complaint:        Chief Complaint  Patient presents with  . Consult    Surgical eval on Pleural mass, Chest CT 05/08/19, Cardiac CT 04/06/19     History of Present Illness:    Beth Dean 69 y.o. female is seen in the office for abnormal CT of the chest.  The patient noted because of elevation in her cholesterol level a CT was done for calcium scoring-on the CT scan was noted and incompletely imaged right lower pleural base chest mass.  Follow-up full CT of the chest was done and the patient referred to thoracic surgery office.   Patient has been healthy with no significant medical issues.  She specifically denies any shortness of breath chest discomfort has had no pain in the right lower chest or dysesthesias of the right chest.   She smoked 2 to 3 years while in college 40 years ago none since   She notes recently a small lesion squamous cell carcinoma of the skin was removed from her right forearm.   Since last seen the patient had a CT directed needle biopsy-confirming fibrous tumor of the chest.  After discussing the natural history and reviewing the CT scans with the patient and had been recommended to consider surgical resection.  Initially the patient wanted to wait, but now comes to the office to discuss proceeding with surgical resection.  Current Activity/ Functional Status:  Patient is independent with mobility/ambulation, transfers, ADL's, IADL's.   Zubrod Score: At the time of surgery this patient's most appropriate activity status/level should be described as: [x]     0    Normal activity, no symptoms []     1    Restricted in physical  strenuous activity but ambulatory, able to do out light work []     2    Ambulatory and capable of self care, unable to do work activities, up and about               >50 % of waking hours                              []     3    Only limited self care, in bed greater than 50% of waking hours []     4    Completely disabled, no self care, confined to bed or chair []     5    Moribund   Past Medical History:  Diagnosis Date  . Folliculitis   . Hyperlipidemia   . Osteopenia   . Pleural mass     Past Surgical History:  Procedure Laterality Date  . LEEP    . TONSILLECTOMY      Family History  Problem Relation Age of Onset  . Heart disease Mother   . Cancer Father   . Hyperlipidemia Sister    Family history significant for her father who died at age 42 of lung cancer metastatic to the brain, mother had  bypass surgery ultimately died of a stroke at age 68. -2 children who are healthy  Social History   Tobacco Use  Smoking Status Former Smoker  . Types: Cigarettes  . Quit date: 5  . Years since quitting: 46.4  Smokeless Tobacco Never Used  Tobacco Comment   quit 35 years ago    Social History   Substance and Sexual Activity  Alcohol Use Yes  . Alcohol/week: 1.0 standard drinks  . Types: 1 Glasses of wine per week   Comment: On weekend     No Known Allergies  Current Facility-Administered Medications  Medication Dose Route Frequency Provider Last Rate Last Admin  . ceFAZolin (ANCEF) IVPB 2g/100 mL premix  2 g Intravenous 30 min Pre-Op Grace Isaac, MD          Review of Systems:     Cardiac Review of Systems: [Y] = yes  or   [ N ] = no   Chest Pain [  n  ]  Resting SOB [n  ] Exertional SOB  [  n]  Orthopnea [ n ]   Pedal Edema [ n  ]    Palpitations [ n ] Syncope  [  n]   Presyncope [  n ]   General Review of Systems: [Y] = yes [  ]=no Constitional: recent weight change [  ];  Wt loss over the last 3 months [   ] anorexia [  ]; fatigue [  ]; nausea [  ];  night sweats [  ]; fever [  ]; or chills [  ];           Eye : blurred vision [  ]; diplopia [   ]; vision changes [  ];  Amaurosis fugax[  ]; Resp: cough [  ];  wheezing[  ];  hemoptysis[  ]; shortness of breath[  ]; paroxysmal nocturnal dyspnea[  ]; dyspnea on exertion[  ]; or orthopnea[  ];  GI:  gallstones[  ], vomiting[  ];  dysphagia[  ]; melena[  ];  hematochezia [  ]; heartburn[  ];   Hx of  Colonoscopy[  ]; GU: kidney stones [  ]; hematuria[  ];   dysuria [  ];  nocturia[  ];  history of     obstruction [  ]; urinary frequency [  ]             Skin: rash, swelling[  ];, hair loss[  ];  peripheral edema[  ];  or itching[  ]; Musculosketetal: myalgias[  ];  joint swelling[  ];  joint erythema[  ];  joint pain[  ];  back pain[  ];  Heme/Lymph: bruising[  ];  bleeding[  ];  anemia[  ];  Neuro: TIA[  ];  headaches[  ];  stroke[  ];  vertigo[  ];  seizures[  ];   paresthesias[  ];  difficulty walking[  ];  Psych:depression[  ]; anxiety[  ];  Endocrine: diabetes[  ];  thyroid dysfunction[  ];  Immunizations: Flu up to date [  ]; Pneumococcal up to date [  ];  Other:     PHYSICAL EXAMINATION: BP (!) 143/68   Pulse 65   Temp (!) 97.5 F (36.4 C) (Tympanic)   Resp 18   Ht 5\' 8"  (1.727 m)   Wt 70.3 kg   SpO2 98%   BMI 23.57 kg/m  General appearance: alert and no distress Neck: no adenopathy, no carotid bruit, no  JVD, supple, symmetrical, trachea midline and thyroid not enlarged, symmetric, no tenderness/mass/nodules Lymph nodes: Cervical, supraclavicular, and axillary nodes normal. Resp: clear to auscultation bilaterally Cardio: regular rate and rhythm, S1, S2 normal, no murmur, click, rub or gallop GI: soft, non-tender; bowel sounds normal; no masses,  no organomegaly Extremities: extremities normal, atraumatic, no cyanosis or edema   Diagnostic Studies & Laboratory data:     Recent Radiology Findings:   CT CHEST W CONTRAST  Result Date: 05/09/2019 CLINICAL DATA:  Incidental  pulmonary mass identified on chest CT for calcium scoring. EXAM: CT CHEST WITH CONTRAST TECHNIQUE: Multidetector CT imaging of the chest was performed during intravenous contrast administration. CONTRAST:  8mL ISOVUE-300 IOPAMIDOL (ISOVUE-300) INJECTION 61% COMPARISON:  CT 04/06/2019 FINDINGS: Cardiovascular: No significant vascular findings. Normal heart size. No pericardial effusion. Mediastinum/Nodes: No axillary or supraclavicular adenopathy. No mediastinal hilar adenopathy. No pericardial effusion. Esophagus normal. Lungs/Pleura: Ovoid solid mass along the pleural surface of the RIGHT lower lobe measures 7.9 x 3.5 by 4.3 cm. The mass is smoothly marginated and shares a broad 7 cm surface with the posterior pleura as well as the pleural surface of the diaphragm. There is vascular supply from the a pulmonary artery with serpiginous vessels noted within the mass. No evidence of involvement of the chest wall or adjacent ribs. Elsewhere in the lungs no nodularity.  Airways are normal. Upper Abdomen: Limited view of the liver, kidneys, pancreas are unremarkable. Normal adrenal glands. Musculoskeletal: No aggressive osseous lesion. IMPRESSION: 1. 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 Recommend cardiothoracic surgery consultation for potential resection. [facility] [phone]. 2. Otherwise normal CT of thorax. These results will be called to the ordering clinician or representative by the Radiologist Assistant, and communication documented in the PACS or zVision Dashboard. Electronically Signed   By: Suzy Bouchard M.D.   On: 05/09/2019 09:37     I have independently reviewed the above radiology studies  and reviewed the findings with the patient.  Patient has no previous CT scans For comparison  Recent Lab Findings: Lab Results  Component Value Date   WBC  6.1 07/31/2019   HGB 12.6 07/31/2019   HCT 39.2 07/31/2019   PLT 255 07/31/2019   GLUCOSE 118 (H) 07/31/2019   ALT 20 07/31/2019   AST 24 07/31/2019   NA 139 07/31/2019   K 3.6 07/31/2019   CL 105 07/31/2019   CREATININE 0.85 07/31/2019   BUN 20 07/31/2019   CO2 22 07/31/2019   INR 0.9 07/31/2019   Clinical History: right pleural mass, no primary (cm)      FINAL MICROSCOPIC DIAGNOSIS:   A. PLEURAL MASS, RIGHT, NEEDLE CORE BIOPSY:  - Solitary fibrous tumor. See comment    COMMENT:   Immunohistochemical stains show the tumor cells are positive for CD34,  vimentin, BCL-2 and CD99 (weak to moderate). The tumor cells are  negative for calretinin, D2-40, desmin, EMA, MOC31 and SMA. This  immunohistochemical profile is consistent with above interpretation.  Dr. Saralyn Pilar reviewed the case and concurs with the diagnosis.    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  The expectations of postop recovery, possible complications, recovery time have been discussed.  Patient is agreeable with proceeding.   The goals risks and alternatives of the planned surgical procedure Procedure(s): VIDEO BRONCHOSCOPY (N/A) XI ROBOTIC ASSISTED THORASCOPY-RESECTION OF CHEST WALL MASS (Right)  have been discussed with the patient in detail. The risks of the procedure including death, infection, stroke, myocardial infarction, bleeding, blood transfusion have all been discussed specifically.  I have quoted Beth Dean a 1 % of perioperative mortality . The patient's questions have been answered.Beth Dean is willing  to proceed with the planned procedure.  Grace Isaac MD      [address] [place] [address] Office [phone]     08/02/2019 6:40 AM

## 2019-08-02 NOTE — Anesthesia Procedure Notes (Signed)
Central Venous Catheter Insertion Performed by: Roderic Palau, MD, anesthesiologist Start/End5/26/2021 6:57 AM, 08/02/2019 7:07 AM Patient location: Pre-op. Preanesthetic checklist: patient identified, IV checked, site marked, risks and benefits discussed, surgical consent, monitors and equipment checked, pre-op evaluation, timeout performed and anesthesia consent Position: Trendelenburg Lidocaine 1% used for infiltration and patient sedated Hand hygiene performed , maximum sterile barriers used  and Seldinger technique used Catheter size: 8 Fr Total catheter length 16. Central line was placed.Double lumen Procedure performed without using ultrasound guided technique. Attempts: 1 Following insertion, dressing applied, line sutured and Biopatch. Post procedure assessment: blood return through all ports  Patient tolerated the procedure well with no immediate complications.

## 2019-08-02 NOTE — Plan of Care (Signed)

## 2019-08-02 NOTE — Plan of Care (Signed)
  Problem: Education: Goal: Knowledge of General Education information will improve Description: Including pain rating scale, medication(s)/side effects and non-pharmacologic comfort measures Outcome: Progressing   Problem: Health Behavior/Discharge Planning: Goal: Ability to manage health-related needs will improve Outcome: Progressing   Problem: Clinical Measurements: Goal: Will remain free from infection Outcome: Progressing Goal: Diagnostic test results will improve Outcome: Progressing Goal: Cardiovascular complication will be avoided Outcome: Progressing   Problem: Nutrition: Goal: Adequate nutrition will be maintained Outcome: Progressing   Problem: Coping: Goal: Level of anxiety will decrease Outcome: Progressing   

## 2019-08-02 NOTE — Progress Notes (Signed)
Assisted to the bathroom and voided. Complained of  Pain on the surgical site which subsided after few min. Continue to monitor.

## 2019-08-03 ENCOUNTER — Inpatient Hospital Stay (HOSPITAL_COMMUNITY): Payer: Medicare PPO

## 2019-08-03 LAB — GLUCOSE, CAPILLARY
Glucose-Capillary: 88 mg/dL (ref 70–99)
Glucose-Capillary: 105 mg/dL — ABNORMAL HIGH (ref 70–99)

## 2019-08-03 LAB — BASIC METABOLIC PANEL
Anion gap: 8 (ref 5–15)
BUN: 15 mg/dL (ref 8–23)
CO2: 26 mmol/L (ref 22–32)
Calcium: 8.5 mg/dL — ABNORMAL LOW (ref 8.9–10.3)
Chloride: 103 mmol/L (ref 98–111)
Creatinine, Ser: 0.92 mg/dL (ref 0.44–1.00)
GFR calc Af Amer: >60 mL/min (ref >60)
GFR calc non Af Amer: >60 mL/min (ref >60)
Glucose, Bld: 101 mg/dL — ABNORMAL HIGH (ref 70–99)
Potassium: 4.1 mmol/L (ref 3.5–5.1)
Sodium: 137 mmol/L (ref 135–145)

## 2019-08-03 LAB — SURGICAL PATHOLOGY

## 2019-08-03 LAB — CBC
HCT: 35.4 % — ABNORMAL LOW (ref 36.0–46.0)
Hemoglobin: 11.5 g/dL — ABNORMAL LOW (ref 12.0–15.0)
MCH: 29.9 pg (ref 26.0–34.0)
MCHC: 32.5 g/dL (ref 30.0–36.0)
MCV: 92.2 fL (ref 80.0–100.0)
Platelets: 217 10*3/uL (ref 150–400)
RBC: 3.84 MIL/uL — ABNORMAL LOW (ref 3.87–5.11)
RDW: 12.6 % (ref 11.5–15.5)
WBC: 10.4 10*3/uL (ref 4.0–10.5)
nRBC: 0 % (ref 0.0–0.2)

## 2019-08-03 MED ORDER — TRAMADOL HCL 50 MG PO TABS: 50.0000 mg | ORAL_TABLET | Freq: Two times a day (BID) | ORAL | 0 refills | Status: DC | PRN

## 2019-08-03 MED ORDER — ACETAMINOPHEN 500 MG PO TABS: 1000.0000 mg | ORAL_TABLET | Freq: Four times a day (QID) | ORAL | 0 refills | Status: DC

## 2019-08-03 NOTE — Progress Notes (Signed)
SATURATION QUALIFICATIONS:   Patient Saturations on Room Air at Rest = 95%  Patient Saturations on Room Air while Ambulating = 91%  Patient Saturations on N/A Liters of oxygen while Ambulating = N/A %  Please briefly explain why patient needs home oxygen: N/A

## 2019-08-03 NOTE — Progress Notes (Addendum)
Patient ID: Beth Dean, female   DOB: 1950-10-08, 69 y.o.   MRN: [id number] TCTS DAILY ICU PROGRESS NOTE                   [address]            [place] [address]          [phone]   1 Day Post-Op Procedure(s) (LRB): XI ROBOTIC ASSISTED THORASCOPY-RESECTION OF FIBEROUS TUMOR OF THE CHEST (Right) Intercostal Nerve Block (Right)  Total Length of Stay:  LOS: 1 day   Subjective: Patient feels well this morning, up eating breakfast denies nausea some discomfort from chest tube  Objective: Vital signs in last 24 hours: Temp:  [96.9 F (36.1 C)-98.3 F (36.8 C)] 98.1 F (36.7 C) (05/27 0414) Pulse Rate:  [60-69] 67 (05/27 0414) Cardiac Rhythm: Normal sinus rhythm (05/27 0712) Resp:  [11-20] 16 (05/27 0414) BP: (94-135)/(47-79) 99/48 (05/27 0414) SpO2:  [93 %-100 %] 96 % (05/27 0000) Arterial Line BP: (141-155)/(57-75) 146/60 (05/26 1235)  Filed Weights   08/02/19 0556  Weight: 70.3 kg    Weight change:    Hemodynamic parameters for last 24 hours:    Intake/Output from previous day: 05/26 0701 - 05/27 0700 In: 900.1 [I.V.:800; IV Piggyback:100.1] Out: 290 [Urine:180; Blood:25; Chest Tube:85]  Intake/Output this shift: No intake/output data recorded.  Current Meds: Scheduled Meds: . acetaminophen  1,000 mg Oral Q6H   Or  . acetaminophen (TYLENOL) oral liquid 160 mg/5 mL  1,000 mg Oral Q6H  . bisacodyl  10 mg Oral Daily  . bupivacaine liposome  20 mL Infiltration Once  . Chlorhexidine Gluconate Cloth  6 each Topical Daily  . enoxaparin (LOVENOX) injection  40 mg Subcutaneous Daily  . insulin aspart  0-24 Units Subcutaneous TID AC & HS  . ketorolac  15 mg Intravenous Q6H  . rosuvastatin  10 mg Oral Daily  . senna-docusate  1 tablet Oral QHS   Continuous Infusions: . sodium chloride     PRN Meds:.Place/Maintain arterial line **AND** sodium chloride, morphine injection, ondansetron (ZOFRAN) IV, traMADol  General appearance: alert,  cooperative and no distress Neurologic: intact Heart: regular rate and rhythm, S1, S2 normal, no murmur, click, rub or gallop Lungs: clear to auscultation bilaterally Abdomen: soft, non-tender; bowel sounds normal; no masses,  no organomegaly Extremities: extremities normal, atraumatic, no cyanosis or edema and Homans sign is negative, no sign of DVT Wound: No air leak minimal chest tube drainage  Lab Results: CBC: Recent Labs    07/31/19 1412 08/03/19 0521  WBC 6.1 10.4  HGB 12.6 11.5*  HCT 39.2 35.4*  PLT 255 217   BMET:  Recent Labs    07/31/19 1412 08/03/19 0521  NA 139 137  K 3.6 4.1  CL 105 103  CO2 22 26  GLUCOSE 118* 101*  BUN 20 15  CREATININE 0.85 0.92  CALCIUM 9.3 8.5*    CMET: Lab Results  Component Value Date   WBC 10.4 08/03/2019   HGB 11.5 (L) 08/03/2019   HCT 35.4 (L) 08/03/2019   PLT 217 08/03/2019   GLUCOSE 101 (H) 08/03/2019   ALT 20 07/31/2019   AST 24 07/31/2019   NA 137 08/03/2019   K 4.1 08/03/2019   CL 103 08/03/2019   CREATININE 0.92 08/03/2019   BUN 15 08/03/2019   CO2 26 08/03/2019   INR 0.9 07/31/2019      PT/INR:  Recent Labs    07/31/19 1412  LABPROT 12.2  INR 0.9   Radiology: [facility] Chest Port 1 View  Result Date: 08/02/2019 CLINICAL DATA:  Status post robot assisted surgical procedure. Right chest tube. EXAM: PORTABLE CHEST 1 VIEW COMPARISON:  07/31/2019 chest radiograph. FINDINGS: Right apical chest tube is in place. Right subclavian central venous catheter terminates in the lower third of the SVC. Stable cardiomediastinal silhouette with normal heart size. Tiny (less than 5%) right apical pneumothorax. No left pneumothorax. No pleural effusion. Interval resection of right basilar pleural mass. No pulmonary edema. No acute consolidative airspace disease. IMPRESSION: 1. Tiny right apical pneumothorax with right apical chest tube in place. 2. Interval resection of right basilar pleural mass. Electronically Signed   By: Ilona Sorrel M.D.   On: 08/02/2019 11:05   Chest x-ray done this morning reviewed-formal report not done yet appears stable  Assessment/Plan: S/P Procedure(s) (LRB): XI ROBOTIC ASSISTED THORASCOPY-RESECTION OF FIBEROUS TUMOR OF THE CHEST (Right) Intercostal Nerve Block (Right) Mobilize d/c tubes/lines DC central line chest tube this morning-ambulate possible discharge home later today if comfortable Home instructions as far as limiting lifting and wound care discussed. Expected Acute  Blood - loss Anemia- continue to monitor - no blood products given   [place] 08/03/2019 7:50 AM

## 2019-08-03 NOTE — Discharge Summary (Addendum)
Physician Discharge Summary  Patient ID: Beth Dean MRN: [id number] DOB/AGE: 05/05/1950 69 y.o.  Admit date: 08/02/2019 Discharge date: 08/03/2019  Admission Diagnoses:  Discharge Diagnoses:  Active Problems:   S/P robot-assisted surgical procedure   Discharged Condition: good  HPI:   Beth Dean 69 y.o. female is seen in the office for abnormal CT of the chest.  The patient noted because of elevation in her cholesterol level a CT was done for calcium scoring-on the CT scan was noted and incompletely imaged right lower pleural base chest mass.  Follow-up full CT of the chest was done and the patient referred to thoracic surgery office.   Patient has been healthy with no significant medical issues.  She specifically denies any shortness of breath chest discomfort has had no pain in the right lower chest or dysesthesias of the right chest.   She smoked 2 to 3 years while in college 40 years ago none since   She notes recently a small lesion squamous cell carcinoma of the skin was removed from her right forearm.   Since last seen the patient had a CT directed needle biopsy-confirming fibrous tumor of the chest.  After discussing the natural history and reviewing the CT scans with the patient and had been recommended to consider surgical resection.  Initially the patient wanted to wait, but now comes to the office to discuss proceeding with surgical resection.  Hospital Course:   Beth Dean underwent a robotic assisted thoracoscopy-resection of fibrous tumor of the chest and intercostal nerve block with Dr. Servando Snare. She tolerated the procedure well and was transferred to the stepdown unit. She is tolerating room air POD 1. Her chest tube and central line were removed. She was ambulating around the unit with limited assistance. She was cleared for discharge home.    Consults: None  Significant Diagnostic Studies:    CLINICAL DATA:  Chest tube  placement.  EXAM: CHEST  1 VIEW  COMPARISON:  08/02/2019  FINDINGS: Right subclavian Port-A-Cath unchanged. Lungs are adequately inflated without focal airspace consolidation, effusion or pneumothorax. Right-sided chest tube unchanged. Cardiomediastinal silhouette and remainder the exam is unchanged.  IMPRESSION: No acute cardiopulmonary disease. Right-sided chest tube in place without evidence of pneumothorax.   Electronically Signed   By: Marin Olp M.D.   On: 08/03/2019 08:43  Treatments:  PROCEDURE:  Procedure(s): XI ROBOTIC ASSISTED THORASCOPY-RESECTION OF FIBEROUS TUMOR OF THE CHEST (Right) Intercostal Nerve Block (Right)  **Refer to official op note  Discharge Exam: Blood pressure 103/61, pulse 71, temperature 98.1 F (36.7 C), temperature source Oral, resp. rate 15, height 5\' 8"  (1.727 m), weight 70.3 kg, SpO2 97 %.   General appearance: alert, cooperative and no distress Neurologic: intact Heart: regular rate and rhythm, S1, S2 normal, no murmur, click, rub or gallop Lungs: clear to auscultation bilaterally Abdomen: soft, non-tender; bowel sounds normal; no masses,  no organomegaly Extremities: extremities normal, atraumatic, no cyanosis or edema and Homans sign is negative, no sign of DVT Wound: No air leak minimal chest tube drainage  Disposition: Discharge disposition: 01-Home or Self Care       Allergies as of 08/03/2019   No Known Allergies     Medication List    TAKE these medications   acetaminophen 500 MG tablet Commonly known as: TYLENOL Take 2 tablets (1,000 mg total) by mouth every 6 (six) hours.   CALCIUM 600 + D PO Take 2 tablets by mouth daily.   MULTI-VITAMIN DAILY PO  Take 1 tablet by mouth daily.   rosuvastatin 10 MG tablet Commonly known as: CRESTOR Take 10 mg by mouth daily.   traMADol 50 MG tablet Commonly known as: ULTRAM Take 1 tablet (50 mg total) by mouth every 12 (twelve) hours as needed (mild pain).    zinc gluconate 50 MG tablet Take 50 mg by mouth daily.      *Tramadol 50mg  q 12 hours PRN printed for the patient.   Signed: [name] 08/03/2019, 2:40 PM

## 2019-08-03 NOTE — Plan of Care (Signed)
  Problem: Education: Goal: Knowledge of General Education information will improve Description: Including pain rating scale, medication(s)/side effects and non-pharmacologic comfort measures Outcome: Progressing   Problem: Clinical Measurements: Goal: Ability to maintain clinical measurements within normal limits will improve Outcome: Progressing Goal: Will remain free from infection Outcome: Progressing Goal: Diagnostic test results will improve Outcome: Progressing   Problem: Nutrition: Goal: Adequate nutrition will be maintained Outcome: Progressing   Problem: Coping: Goal: Level of anxiety will decrease Outcome: Progressing

## 2019-08-04 ENCOUNTER — Telehealth: Payer: Self-pay | Admitting: Cardiothoracic Surgery

## 2019-08-04 NOTE — Telephone Encounter (Signed)
Called patient to report path - benign fibrous tumor Patient doing well at home walking around house well No complaints  Grace Isaac MD      [address] [place] [address] Office [phone]

## 2019-08-08 NOTE — Op Note (Signed)
NAME: Beth Dean, Beth Dean MEDICAL RECORD [id number] ACCOUNT [id number] DATE OF BIRTH:Feb 24, 1951 FACILITY: [facility] LOCATION: [facility] PHYSICIAN:Xiana Carns BServando Snare, MD  OPERATIVE REPORT  DATE OF PROCEDURE:  08/02/2019 Preop : Fibrous tumor of right chest   Post [id number]   PROCEDURE PERFORMED:  Right robotic-assisted resection of the fibrous tumor of the chest with intercostal nerve block with Exparel.  SURGEON:  Lanelle Bal, MD  FIRST ASSISTANT:  Dr. Kipp Brood.  SECOND ASSISTANT:   Nicholes Rough, PA.  BRIEF HISTORY:  The patient presented originally with family history of coronary artery disease and had a screening calcium score CT scan done.  This revealed a very low calcium score, but did reveal an 8 x 4 cm tumor of the right chest in the lower  sulcus suggestive of a fibrous tumor of the chest.  This was further investigated with a full CTA of the chest.  Needle biopsy confirmed fibrous tumor of the chest.  It had been recommended to the patient to proceed with surgical resection.  Initially,  she was somewhat reluctant to do this and a followup CT scan was arranged.  However, with further discussion with her family, the patient did decide to proceed with resection as recommended.  Risks and options were discussed with her and her husband in  detail.  She agreed and signed informed consent.  DESCRIPTION OF PROCEDURE:  With appropriate IVs and arterial lines in place and the right side preoperatively marked, the patient was taken to the operating room and underwent general endotracheal anesthesia without incident.  A double lumen endotracheal  tube was placed.  The camera as part of the endotracheal tube confirmed good position of the tube.  She was then turned in lateral decubitus position with the right side up.  Right chest was prepped with Betadine, draped in a sterile manner.  The right  lung was collapsed.  We started by making a single 8 mm port at approximately anterior axillary  line, 4th intercostal space.  This entered the chest without adhesions.  A mass in the right posterior sulcus was easily seen.  An additional 8 mm port was  placed more superiorly approximately in the 2nd, 3rd intercostal space, an 8 mm port approximately 4 cm lower than the camera and a 12 mm port more posteriorly.  With the 4 ports in place, the robot was positioned and docked.  Appropriate targeting was  performed toward the mass.  A tip up grasper and Cardiere instruments were initially placed.  With this, we were able to get good visualization of the mass.  It was easily movable, did not invade the diaphragm or chest wall and had 2 areas of attachment  to the lung, one slightly more dense and with superficial vessel bleeding into the mass and the left posterior second attachment appeared mostly fibrinous.   With easy mobility of the mass, we then were able to elevate the mass and place a blue load  stapler through the 12 mm port.  The small edge of lung was resected with the mass with 2 firings of the stapler.  With the mass then free, and due to its firm size, we decided to take it out through the 2nd, 3rd intercostal space at that most upper port  site.  Using a hook cautery, the pleura along the 2nd intercostal space where the port was excised.  Through the stapling port a retrieval bag was introduced and the specimen placed into the bag.  The retrieval string was then brought  out through the  incision at the 2nd intercostal space, which had been enlarged enough to retrieve the specimen.  The robot was then undocked and the specimen in the bag was brought out through the higher incision and came out without significant difficulty.  We then  placed under direct scope vision, injected the intercostal spaces each with approximately 10 mL of mixture of Exparel, piperocaine and saline to assist in postoperative pain control.  With the ports all out and no evidence of bleeding from the port  sites, we then  left a 28 Blake chest tube in place and the other remaining port sites were then closed with interrupted 2-0 Vicryl and a 3-0 subcuticular stitch.  Dermabond was applied.  The lung reinflated nicely without air leak.  The patient was  awakened and extubated in the operating room and transferred to the recovery room for postoperative care.  Blood loss was less than 25 mL.  Sponge and needle count was reported as correct at completion of procedure. Patient tolerated procedure without complication.  VN/NUANCE  D:08/03/2019 T:08/03/2019 JOB:[id number]

## 2019-08-21 ENCOUNTER — Other Ambulatory Visit: Payer: Self-pay

## 2019-08-21 ENCOUNTER — Ambulatory Visit
Admission: RE | Admit: 2019-08-21 | Discharge: 2019-08-21 | Disposition: A | Discharge status: Home / Self Care | Payer: Medicare PPO | Source: Ambulatory Visit | Attending: Cardiothoracic Surgery | Admitting: Cardiothoracic Surgery

## 2019-08-21 ENCOUNTER — Ambulatory Visit (INDEPENDENT_AMBULATORY_CARE_PROVIDER_SITE_OTHER): Payer: Self-pay | Admitting: Physician Assistant

## 2019-08-21 ENCOUNTER — Encounter: Payer: Self-pay | Admitting: Physician Assistant

## 2019-08-21 ENCOUNTER — Other Ambulatory Visit: Payer: Self-pay | Admitting: *Deleted

## 2019-08-21 VITALS — BP 127/71 | HR 74 | Temp 97.8°F | Resp 16 | Ht 68.0 in | Wt 154.3 lb

## 2019-08-21 DIAGNOSIS — J948 Other specified pleural conditions: Secondary | ICD-10-CM

## 2019-08-21 DIAGNOSIS — R918 Other nonspecific abnormal finding of lung field: Secondary | ICD-10-CM | POA: Diagnosis not present

## 2019-08-21 DIAGNOSIS — Z9889 Other specified postprocedural states: Secondary | ICD-10-CM

## 2019-08-21 DIAGNOSIS — R222 Localized swelling, mass and lump, trunk: Secondary | ICD-10-CM

## 2019-08-21 NOTE — Progress Notes (Signed)
[address]       [place] [address]             [phone]      Beth Dean is a 69 y.o. female patient status post robotic assisted thoracoscopy with resection of a fibrous tumor of the chest with Dr. Servando Snare.  She presents today for her routine follow-up.   1. Mass of chest wall, right   2. S/P robot-assisted surgical procedure    Past Medical History:  Diagnosis Date  . Folliculitis   . Hyperlipidemia   . Osteopenia   . Pleural mass    No past surgical history pertinent negatives on file. Scheduled Meds: Current Outpatient Medications on File Prior to Visit  Medication Sig Dispense Refill  . Calcium Carb-Cholecalciferol (CALCIUM 600 + D PO) Take 2 tablets by mouth daily.    . Multiple Vitamin (MULTI-VITAMIN DAILY PO) Take 1 tablet by mouth daily.     . rosuvastatin (CRESTOR) 10 MG tablet Take 10 mg by mouth daily.     Marland Kitchen zinc gluconate 50 MG tablet Take 50 mg by mouth daily.     No current facility-administered medications on file prior to visit.    No Known Allergies Active Problems:   * No active hospital problems. *  Blood pressure 127/71, pulse 74, temperature 97.8 F (36.6 C), resp. rate 16, height 5\' 8"  (1.727 m), weight 154 lb 5.2 oz (70 kg), SpO2 98 %.  Subjective  Beth Dean is a 69 year old female who presents today for her routine follow-up visit s/p robotic assisted thoracoscopy with chest wall resection. She overall is doing extremely well.  Objective  Cor: RRR, no murmur Pulm: CTA bilaterally and in all fields Abd: no tenderness Ext:no edema Wound: clean and dry incisions.     Narrative & Impression  CLINICAL DATA:  Post thoracoscopy  EXAM: CHEST - 2 VIEW  COMPARISON:  Radiograph 08/03/2019  FINDINGS: Normal cardiac silhouette. No effusion, infiltrate or pneumothorax. No pleural mass. No acute osseous abnormality.  IMPRESSION: No acute cardiopulmonary findings.   Electronically Signed   By:  Suzy Bouchard M.D.   On: 08/21/2019 14:33    Assessment & Plan   Beth Dean is a 69 year old female patient status post robot-assisted thoracoscopic resection of a benign chest wall tumor with Dr. Servando Snare.  Today, she is doing remarkably well.  She is walking 3 to 4 miles a day without any shortness of breath or pain.  She has not been taking any tramadol for the last several weeks and has rarely needed Tylenol.  I have cleared her to drive.  She is to increase her exercise capabilities slowly over time.  She is able to increase the amount of weight with her upper body by 2 pounds a week.  She can increase her walking and has no limitations with her lower extremity.  I offered cardiac and pulmonary rehab however, the patient however has been doing so well on her own that she passed on this opportunity.  Dr. Pia Mau has requested to see the patient at the 18-month mark to ensure everything is going smoothly with her recovery.  She will get a repeat x-ray at this time.  I did review the x-ray with the patient and husband at the bedside and they were no concerns.  She did have a small amount of erythema on her main incision in the axilla region which did not look infected and did not have any drainage.  I provided the patient with some Betadine swabs paint this particular incision after showering.  Overall, I think the patient is doing exceptionally well and she did not have any significant complaints today.  Our office will arrange a follow-up appointment with Dr. Servando Snare in 2 months.  She is encouraged to call our office with any questions or concerns before then.      [name] 08/21/2019

## 2019-08-21 NOTE — Patient Instructions (Signed)
Follow-up with Dr. Servando Snare at the 3 month mark.  Please call if you have any questions or concerns.

## 2019-10-18 ENCOUNTER — Other Ambulatory Visit: Payer: Self-pay | Admitting: Cardiothoracic Surgery

## 2019-10-18 DIAGNOSIS — J948 Other specified pleural conditions: Secondary | ICD-10-CM

## 2019-10-18 DIAGNOSIS — Z9889 Other specified postprocedural states: Secondary | ICD-10-CM

## 2019-10-19 ENCOUNTER — Ambulatory Visit
Admission: RE | Admit: 2019-10-19 | Discharge: 2019-10-19 | Disposition: A | Discharge status: Home / Self Care | Payer: Medicare PPO | Source: Ambulatory Visit | Attending: Cardiothoracic Surgery | Admitting: Cardiothoracic Surgery

## 2019-10-19 ENCOUNTER — Other Ambulatory Visit: Payer: Self-pay

## 2019-10-19 ENCOUNTER — Ambulatory Visit (INDEPENDENT_AMBULATORY_CARE_PROVIDER_SITE_OTHER): Payer: Self-pay | Admitting: Cardiothoracic Surgery

## 2019-10-19 VITALS — BP 111/67 | HR 77 | Temp 97.8°F | Resp 20 | Ht 68.0 in | Wt 157.0 lb

## 2019-10-19 DIAGNOSIS — R222 Localized swelling, mass and lump, trunk: Secondary | ICD-10-CM

## 2019-10-19 DIAGNOSIS — J948 Other specified pleural conditions: Secondary | ICD-10-CM

## 2019-10-19 DIAGNOSIS — Z9889 Other specified postprocedural states: Secondary | ICD-10-CM

## 2019-10-19 DIAGNOSIS — R918 Other nonspecific abnormal finding of lung field: Secondary | ICD-10-CM | POA: Diagnosis not present

## 2019-10-19 NOTE — Progress Notes (Signed)
[address]       [place] [address]             [phone]      Gerene C Mihalko [place] Medical Record #[id number] Date of Birth: 02/14/1951  Referring: Haywood Pao, MD Primary Care: Tisovec, Fransico Him, MD Primary Cardiologist: No primary care provider on file.   Chief Complaint:   POST OP FOLLOW UP OPERATIVE REPORT DATE OF PROCEDURE:  08/02/2019 Preop : Fibrous tumor of right chest  Post XB:DZHG  PROCEDURE PERFORMED:  Right robotic-assisted resection of the fibrous tumor of the chest with intercostal nerve block with Exparel  FINAL MICROSCOPIC DIAGNOSIS:   A. RIGHT PLEURAL CHEST MASS, RESECTION:  - Solitary fibrous tumor, 8.2 cm.  History of Present Illness:     Patient returns to the office today in follow-up check after previous resection of 8 cm fibrous tumor of the chest, no malignancy was identified.  The patient returned to near normal activities very quickly after the robotic approach.  Currently she has no complaints     Past Medical History:  Diagnosis Date  . Folliculitis   . Hyperlipidemia   . Osteopenia   . Pleural mass      Social History   Tobacco Use  Smoking Status Former Smoker  . Types: Cigarettes  . Quit date: 84  . Years since quitting: 46.6  Smokeless Tobacco Never Used  Tobacco Comment   quit 35 years ago    Social History   Substance and Sexual Activity  Alcohol Use Yes  . Alcohol/week: 1.0 standard drink  . Types: 1 Glasses of wine per week   Comment: On weekend     No Known Allergies  Current Outpatient Medications  Medication Sig Dispense Refill  . Calcium Carb-Cholecalciferol (CALCIUM 600 + D PO) Take 2 tablets by mouth daily.    . Multiple Vitamin (MULTI-VITAMIN DAILY PO) Take 1 tablet by mouth daily.     . rosuvastatin (CRESTOR) 10 MG tablet Take 10 mg by mouth daily.     Marland Kitchen zinc gluconate 50 MG tablet Take 50 mg by mouth daily.     No current facility-administered medications for  this visit.       Physical Exam: BP 111/67   Pulse 77   Temp 97.8 F (36.6 C) (Skin)   Resp 20   Ht 5\' 8"  (1.727 m)   Wt 157 lb (71.2 kg)   SpO2 98%   BMI 23.87 kg/m   General appearance: alert, cooperative, appears stated age and no distress Neurologic: intact Heart: regular rate and rhythm, S1, S2 normal, no murmur, click, rub or gallop Lungs: clear to auscultation bilaterally Abdomen: soft, non-tender; bowel sounds normal; no masses,  no organomegaly Extremities: extremities normal, atraumatic, no cyanosis or edema Wound: Robotic port sites are all well-healed   Diagnostic Studies & Laboratory data:     Recent Radiology Findings:   DG Chest 2 View  Result Date: 10/19/2019 CLINICAL DATA:  Status post recent thoracoscopy EXAM: CHEST - 2 VIEW COMPARISON:  August 21, 2019 chest radiograph; chest CT May 08, 2019 FINDINGS: The lungs are clear. The heart size and pulmonary vascularity are normal. No adenopathy. No pneumothorax or pneumomediastinum. No bone lesions. No evident pleural lesion. IMPRESSION: Lungs clear.  Cardiac silhouette within normal limits. Electronically Signed   By: Lowella Grip III M.D.   On: 10/19/2019 14:51    I have independently reviewed the above radiology studies  and reviewed the findings with the patient.    Recent Lab Findings: Lab Results  Component Value Date   WBC 10.4 08/03/2019   HGB 11.5 (L) 08/03/2019   HCT 35.4 (L) 08/03/2019   PLT 217 08/03/2019   GLUCOSE 101 (H) 08/03/2019   ALT 20 07/31/2019   AST 24 07/31/2019   NA 137 08/03/2019   K 4.1 08/03/2019   CL 103 08/03/2019   CREATININE 0.92 08/03/2019   BUN 15 08/03/2019   CO2 26 08/03/2019   INR 0.9 07/31/2019      Assessment / Plan:   Stable following resection of 8.2 cm fibrous tumor of the right lower chest, at the time of resection the mass was predominantly on a small stalk off the right lower lobe.  Plan to see the patient back in 6 months with a follow-up CT of  the chest   Medication Changes: No orders of the defined types were placed in this encounter.     Grace Isaac MD      [address] [place] [address] Office [phone]     10/19/2019 3:04 PM

## 2019-12-20 DIAGNOSIS — Z23 Encounter for immunization: Secondary | ICD-10-CM | POA: Diagnosis not present

## 2019-12-28 DIAGNOSIS — H43392 Other vitreous opacities, left eye: Secondary | ICD-10-CM | POA: Diagnosis not present

## 2019-12-28 DIAGNOSIS — H43812 Vitreous degeneration, left eye: Secondary | ICD-10-CM | POA: Diagnosis not present

## 2019-12-28 DIAGNOSIS — H538 Other visual disturbances: Secondary | ICD-10-CM | POA: Diagnosis not present

## 2019-12-28 DIAGNOSIS — H2513 Age-related nuclear cataract, bilateral: Secondary | ICD-10-CM | POA: Diagnosis not present

## 2020-03-19 DIAGNOSIS — E785 Hyperlipidemia, unspecified: Secondary | ICD-10-CM | POA: Diagnosis not present

## 2020-03-26 ENCOUNTER — Other Ambulatory Visit: Payer: Self-pay | Admitting: *Deleted

## 2020-03-26 DIAGNOSIS — G4700 Insomnia, unspecified: Secondary | ICD-10-CM | POA: Diagnosis not present

## 2020-03-26 DIAGNOSIS — I2584 Coronary atherosclerosis due to calcified coronary lesion: Secondary | ICD-10-CM | POA: Diagnosis not present

## 2020-03-26 DIAGNOSIS — D219 Benign neoplasm of connective and other soft tissue, unspecified: Secondary | ICD-10-CM | POA: Diagnosis not present

## 2020-03-26 DIAGNOSIS — E785 Hyperlipidemia, unspecified: Secondary | ICD-10-CM | POA: Diagnosis not present

## 2020-03-26 DIAGNOSIS — D492 Neoplasm of unspecified behavior of bone, soft tissue, and skin: Secondary | ICD-10-CM

## 2020-03-26 DIAGNOSIS — Z0000 Encounter for general adult medical examination without abnormal findings: Secondary | ICD-10-CM | POA: Diagnosis not present

## 2020-03-27 DIAGNOSIS — Z1212 Encounter for screening for malignant neoplasm of rectum: Secondary | ICD-10-CM | POA: Diagnosis not present

## 2020-04-09 DIAGNOSIS — Z6823 Body mass index (BMI) 23.0-23.9, adult: Secondary | ICD-10-CM | POA: Diagnosis not present

## 2020-04-09 DIAGNOSIS — Z01419 Encounter for gynecological examination (general) (routine) without abnormal findings: Secondary | ICD-10-CM | POA: Diagnosis not present

## 2020-04-09 DIAGNOSIS — Z1231 Encounter for screening mammogram for malignant neoplasm of breast: Secondary | ICD-10-CM | POA: Diagnosis not present

## 2020-04-16 DIAGNOSIS — Z13820 Encounter for screening for osteoporosis: Secondary | ICD-10-CM | POA: Diagnosis not present

## 2020-05-02 ENCOUNTER — Ambulatory Visit
Admission: RE | Admit: 2020-05-02 | Discharge: 2020-05-02 | Disposition: A | Discharge status: Home / Self Care | Payer: Medicare PPO | Source: Ambulatory Visit | Attending: Cardiothoracic Surgery | Admitting: Cardiothoracic Surgery

## 2020-05-02 ENCOUNTER — Ambulatory Visit: Payer: Medicare PPO | Admitting: Cardiothoracic Surgery

## 2020-05-02 ENCOUNTER — Encounter: Payer: Self-pay | Admitting: Cardiothoracic Surgery

## 2020-05-02 ENCOUNTER — Other Ambulatory Visit: Payer: Self-pay

## 2020-05-02 VITALS — BP 112/71 | HR 64 | Resp 20 | Ht 68.0 in

## 2020-05-02 DIAGNOSIS — D492 Neoplasm of unspecified behavior of bone, soft tissue, and skin: Secondary | ICD-10-CM

## 2020-05-02 DIAGNOSIS — Z9889 Other specified postprocedural states: Secondary | ICD-10-CM

## 2020-05-02 DIAGNOSIS — R918 Other nonspecific abnormal finding of lung field: Secondary | ICD-10-CM | POA: Diagnosis not present

## 2020-05-02 NOTE — Progress Notes (Signed)
[address]       [place] [address]             [phone]     Care Beth Dean [place] Medical Record #[id number] Date of Birth: 31-Mar-1950  Referring: Haywood Pao, MD Primary Care: Haywood Pao, MD   Chief Complaint:   POST OP FOLLOW UP OPERATIVE REPORT DATE OF PROCEDURE:  08/02/2019 Preop : Fibrous tumor of right chest  Post YB:OFBP  PROCEDURE PERFORMED:  Right robotic-assisted resection of the fibrous tumor of the chest with intercostal nerve block with Exparel  FINAL MICROSCOPIC DIAGNOSIS:   A. RIGHT PLEURAL CHEST MASS, RESECTION:  - Solitary fibrous tumor, 8.2 cm.  History of Present Illness:     Patient returns to the office today in follow-up check after previous resection of 8 cm fibrous tumor of the chest, no malignancy was identified.   Follow-up CT scan was done today  Patient has done well postoperatively denies any pain or discomfort along her incision sites over the right chest wall.     Past Medical History:  Diagnosis Date  . Folliculitis   . Hyperlipidemia   . Osteopenia   . Pleural mass      Social History   Tobacco Use  Smoking Status Former Smoker  . Types: Cigarettes  . Quit date: 33  . Years since quitting: 47.1  Smokeless Tobacco Never Used  Tobacco Comment   quit 35 years ago    Social History   Substance and Sexual Activity  Alcohol Use Yes  . Alcohol/week: 1.0 standard drink  . Types: 1 Glasses of wine per week   Comment: On weekend     No Known Allergies  Current Outpatient Medications  Medication Sig Dispense Refill  . Calcium Carb-Cholecalciferol (CALCIUM 600 + D PO) Take 2 tablets by mouth daily.    . Multiple Vitamin (MULTI-VITAMIN DAILY PO) Take 1 tablet by mouth daily.     . rosuvastatin (CRESTOR) 10 MG tablet Take 10 mg by mouth daily.      No current facility-administered medications for this visit.       Physical Exam: BP 112/71 (BP Location: Left Arm,  Patient Position: Sitting)   Pulse 64   Resp 20   Ht 5\' 8"  (1.727 m)   SpO2 97% Comment: RA  BMI 23.87 kg/m   General appearance: alert, cooperative and no distress Lymph nodes: Cervical, supraclavicular, and axillary nodes normal. Resp: clear to auscultation bilaterally Cardio: regular rate and rhythm, S1, S2 normal, no murmur, click, rub or gallop Extremities: extremities normal, atraumatic, no cyanosis or edema and Homans sign is negative, no sign of DVT Neurologic: Grossly normal Right chest port sites were all well-healed  Diagnostic Studies & Laboratory data:     Recent Radiology Findings:   CT CHEST WO CONTRAST  Result Date: 05/02/2020 CLINICAL DATA:  Follow-up resection of fibrous tumor of the chest. EXAM: CT CHEST WITHOUT CONTRAST TECHNIQUE: Multidetector CT imaging of the chest was performed following the standard protocol without IV contrast. COMPARISON:  05/08/2019 FINDINGS: Cardiovascular: Heart size appears within normal limits. No pericardial effusion. Mediastinum/Nodes: No enlarged mediastinal or axillary lymph nodes. Thyroid gland, trachea, and esophagus demonstrate no significant findings. Lungs/Pleura: No pleural effusion. Status post resection of pleural tumor overlying the posterior right lung base. No signs of residual or recurrent tumor. Small nodule within the posterior left lower lobe measures 4 mm and is unchanged from previous exam, image 137/8.  Subpleural nodule overlying the posteromedial left lower lobe measures 4 mm and is also unchanged, image 117/8. No new or suspicious lung nodules. Upper Abdomen: No acute abnormality within the imaged portions of the upper abdomen. Musculoskeletal: No chest wall mass or suspicious bone lesions identified. IMPRESSION: 1. Status post resection of pleural tumor overlying the posterior right lung base. No signs of residual or recurrent tumor. 2. Stable small left lower lobe lung nodules. Electronically Signed   By: Kerby Moors  M.D.   On: 05/02/2020 11:14    I have independently reviewed the above radiology studies  and reviewed the findings with the patient.    Recent Lab Findings: Lab Results  Component Value Date   WBC 10.4 08/03/2019   HGB 11.5 (L) 08/03/2019   HCT 35.4 (L) 08/03/2019   PLT 217 08/03/2019   GLUCOSE 101 (H) 08/03/2019   ALT 20 07/31/2019   AST 24 07/31/2019   NA 137 08/03/2019   K 4.1 08/03/2019   CL 103 08/03/2019   CREATININE 0.92 08/03/2019   BUN 15 08/03/2019   CO2 26 08/03/2019   INR 0.9 07/31/2019      Assessment / Plan:   Stable following resection of 8.2 cm fibrous tumor of the right lower chest, at the time of resection the mass was predominantly on a small stalk off the right lower lobe.  Follow-up CT done today shows no evidence of residual or recurrent mass  Plan follow-up as needed   Medication Changes: No orders of the defined types were placed in this encounter.     Grace Isaac MD      [address] [place] [place] [address] Office [phone]     05/02/2020 12:20 PM

## 2020-07-02 DIAGNOSIS — L570 Actinic keratosis: Secondary | ICD-10-CM | POA: Diagnosis not present

## 2020-07-02 DIAGNOSIS — L819 Disorder of pigmentation, unspecified: Secondary | ICD-10-CM | POA: Diagnosis not present

## 2020-07-02 DIAGNOSIS — L718 Other rosacea: Secondary | ICD-10-CM | POA: Diagnosis not present

## 2020-07-02 DIAGNOSIS — D225 Melanocytic nevi of trunk: Secondary | ICD-10-CM | POA: Diagnosis not present

## 2020-07-02 DIAGNOSIS — D2272 Melanocytic nevi of left lower limb, including hip: Secondary | ICD-10-CM | POA: Diagnosis not present

## 2020-07-02 DIAGNOSIS — L812 Freckles: Secondary | ICD-10-CM | POA: Diagnosis not present

## 2020-07-02 DIAGNOSIS — B351 Tinea unguium: Secondary | ICD-10-CM | POA: Diagnosis not present

## 2020-07-02 DIAGNOSIS — Z85828 Personal history of other malignant neoplasm of skin: Secondary | ICD-10-CM | POA: Diagnosis not present

## 2020-07-02 DIAGNOSIS — L820 Inflamed seborrheic keratosis: Secondary | ICD-10-CM | POA: Diagnosis not present

## 2021-03-26 DIAGNOSIS — D2272 Melanocytic nevi of left lower limb, including hip: Secondary | ICD-10-CM | POA: Diagnosis not present

## 2021-03-26 DIAGNOSIS — D1801 Hemangioma of skin and subcutaneous tissue: Secondary | ICD-10-CM | POA: Diagnosis not present

## 2021-03-26 DIAGNOSIS — D2271 Melanocytic nevi of right lower limb, including hip: Secondary | ICD-10-CM | POA: Diagnosis not present

## 2021-03-26 DIAGNOSIS — B351 Tinea unguium: Secondary | ICD-10-CM | POA: Diagnosis not present

## 2021-03-26 DIAGNOSIS — D2372 Other benign neoplasm of skin of left lower limb, including hip: Secondary | ICD-10-CM | POA: Diagnosis not present

## 2021-03-26 DIAGNOSIS — L821 Other seborrheic keratosis: Secondary | ICD-10-CM | POA: Diagnosis not present

## 2021-03-26 DIAGNOSIS — Z85828 Personal history of other malignant neoplasm of skin: Secondary | ICD-10-CM | POA: Diagnosis not present

## 2021-03-26 DIAGNOSIS — L814 Other melanin hyperpigmentation: Secondary | ICD-10-CM | POA: Diagnosis not present

## 2021-03-26 DIAGNOSIS — D225 Melanocytic nevi of trunk: Secondary | ICD-10-CM | POA: Diagnosis not present

## 2021-03-28 DIAGNOSIS — J069 Acute upper respiratory infection, unspecified: Secondary | ICD-10-CM | POA: Diagnosis not present

## 2021-03-28 DIAGNOSIS — R5081 Fever presenting with conditions classified elsewhere: Secondary | ICD-10-CM | POA: Diagnosis not present

## 2021-04-01 DIAGNOSIS — E7849 Other hyperlipidemia: Secondary | ICD-10-CM | POA: Diagnosis not present

## 2021-04-01 DIAGNOSIS — D696 Thrombocytopenia, unspecified: Secondary | ICD-10-CM | POA: Diagnosis not present

## 2021-04-08 DIAGNOSIS — G4700 Insomnia, unspecified: Secondary | ICD-10-CM | POA: Diagnosis not present

## 2021-04-08 DIAGNOSIS — Z1212 Encounter for screening for malignant neoplasm of rectum: Secondary | ICD-10-CM | POA: Diagnosis not present

## 2021-04-08 DIAGNOSIS — Z1339 Encounter for screening examination for other mental health and behavioral disorders: Secondary | ICD-10-CM | POA: Diagnosis not present

## 2021-04-08 DIAGNOSIS — Z1331 Encounter for screening for depression: Secondary | ICD-10-CM | POA: Diagnosis not present

## 2021-04-08 DIAGNOSIS — R82998 Other abnormal findings in urine: Secondary | ICD-10-CM | POA: Diagnosis not present

## 2021-04-08 DIAGNOSIS — E7849 Other hyperlipidemia: Secondary | ICD-10-CM | POA: Diagnosis not present

## 2021-04-08 DIAGNOSIS — I2584 Coronary atherosclerosis due to calcified coronary lesion: Secondary | ICD-10-CM | POA: Diagnosis not present

## 2021-04-08 DIAGNOSIS — D219 Benign neoplasm of connective and other soft tissue, unspecified: Secondary | ICD-10-CM | POA: Diagnosis not present

## 2021-04-08 DIAGNOSIS — Z0000 Encounter for general adult medical examination without abnormal findings: Secondary | ICD-10-CM | POA: Diagnosis not present

## 2021-04-10 DIAGNOSIS — Z1231 Encounter for screening mammogram for malignant neoplasm of breast: Secondary | ICD-10-CM | POA: Diagnosis not present

## 2021-04-10 DIAGNOSIS — R52 Pain, unspecified: Secondary | ICD-10-CM | POA: Diagnosis not present

## 2021-05-21 DIAGNOSIS — Z85828 Personal history of other malignant neoplasm of skin: Secondary | ICD-10-CM | POA: Diagnosis not present

## 2021-05-21 DIAGNOSIS — L570 Actinic keratosis: Secondary | ICD-10-CM | POA: Diagnosis not present

## 2021-05-21 DIAGNOSIS — D485 Neoplasm of uncertain behavior of skin: Secondary | ICD-10-CM | POA: Diagnosis not present

## 2021-11-14 IMAGING — CT CT HEART SCORING
3 series · 13 of 20 positions shown, 15 images · non-contrast
Comparison: None.

CLINICAL DATA: Hyperlipidemia.  Ex-smoker.

EXAM:
CT CARDIAC CORONARY ARTERY CALCIUM SCORE
TECHNIQUE: Non-contrast imaging through the heart was performed using
prospective ECG gating. Image post processing was performed on an
independent workstation, allowing for quantitative analysis of the
heart and coronary arteries. Note that this exam targets the heart
and the chest was not imaged in its entirety.

[Series 2: calcium scoring 2.00 qr36 bestdiast 70% · axial · 0.39mm/px · z∈[+1569,+1633]mm · 3 of 80 slices shown]
[im 16/80  vessel]
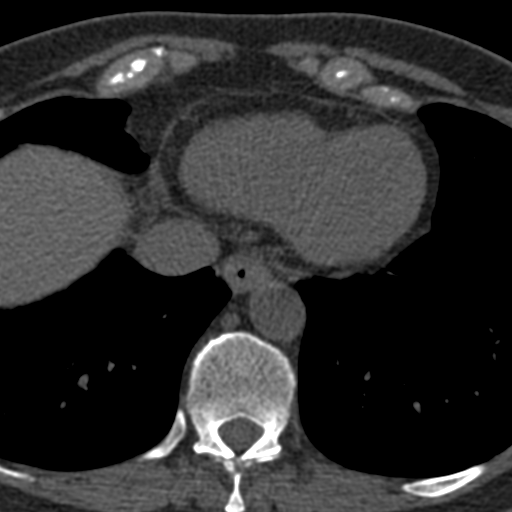
[im 32/80  vessel]
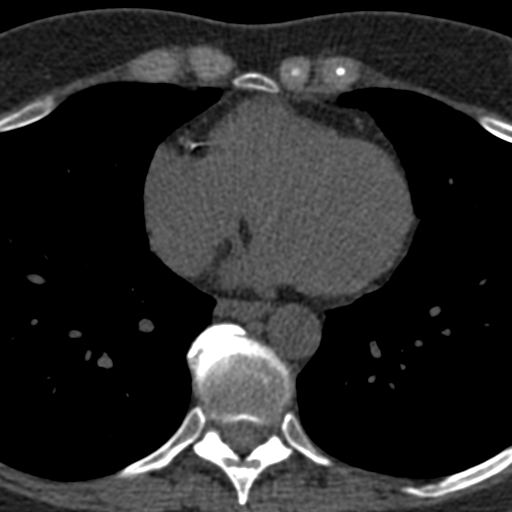
[im 48/80  vessel]
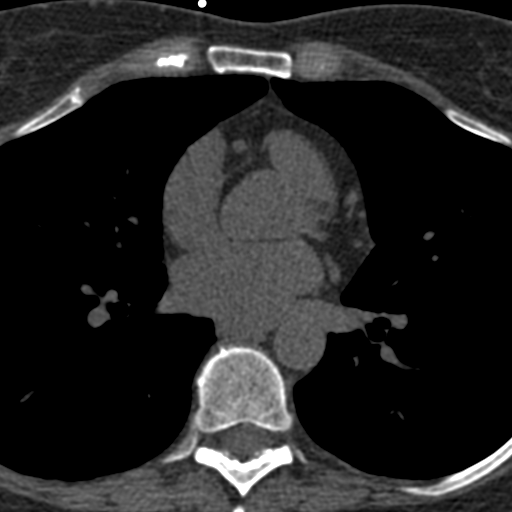

[Series 3: calcium scoring 2.00 br40 bestdiast 70% fov · axial · 0.55mm/px · z∈[+1565,+1669]mm · 5 of 80 slices shown, 7 images]
[im 14/80  vessel]
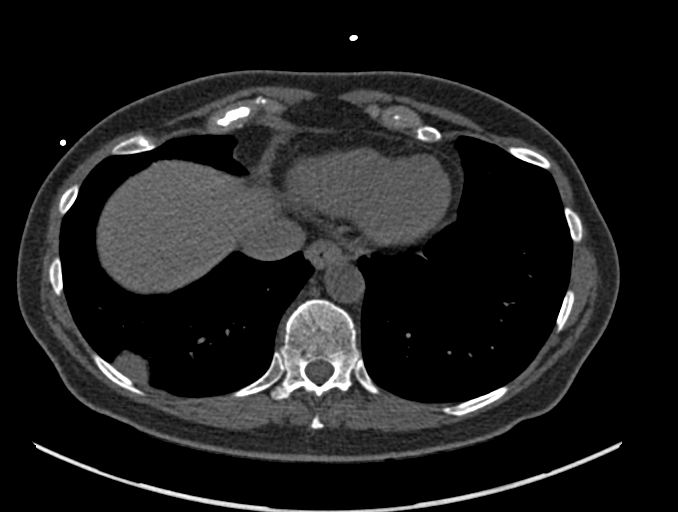
[im 14/80  lung]
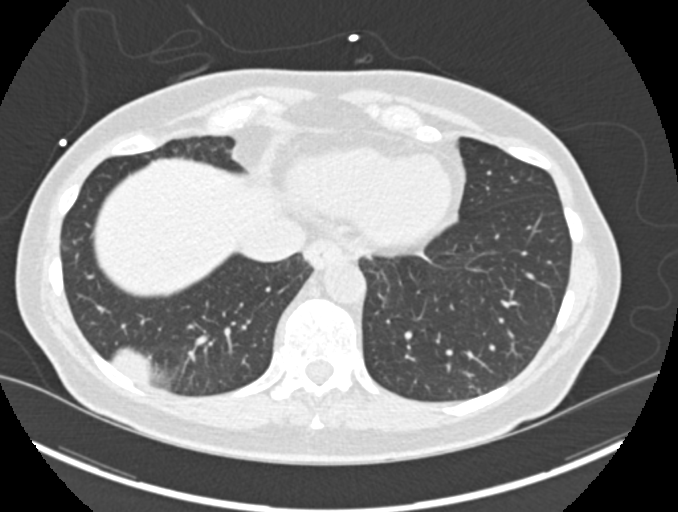
[im 27/80  vessel]
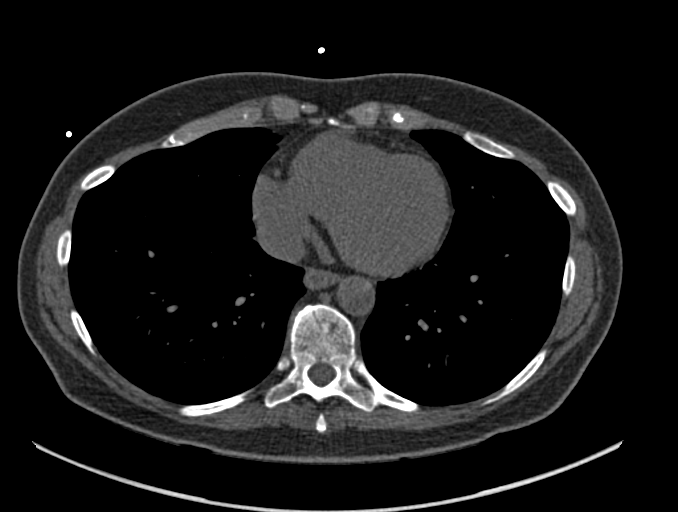
[im 40/80  vessel]
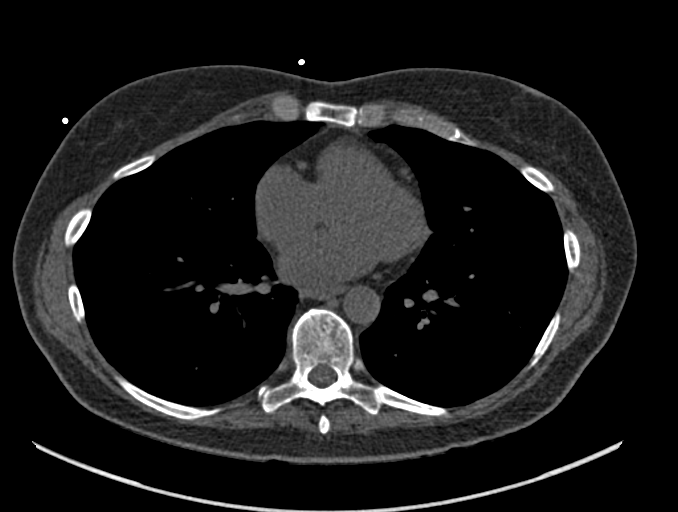
[im 53/80  vessel]
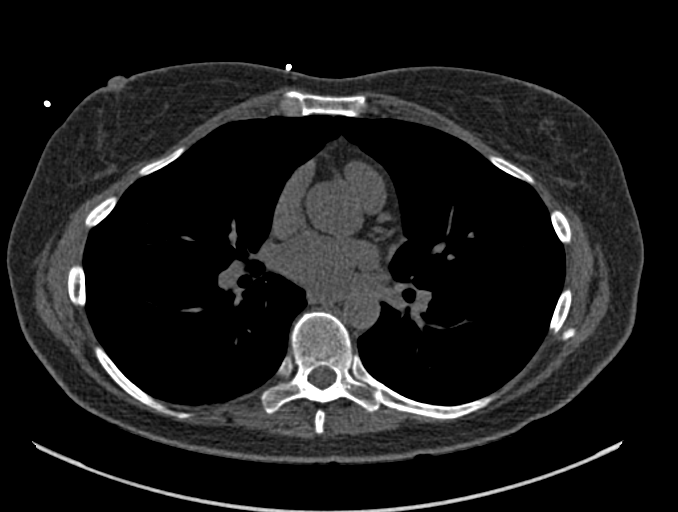
[im 66/80  vessel]
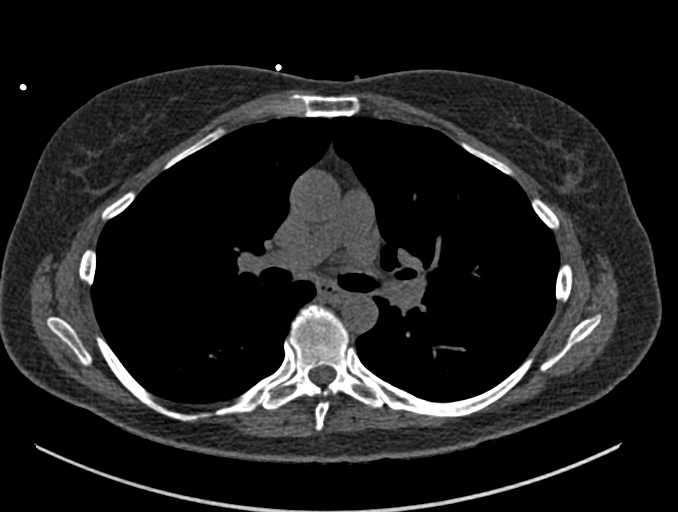
[im 66/80  lung]
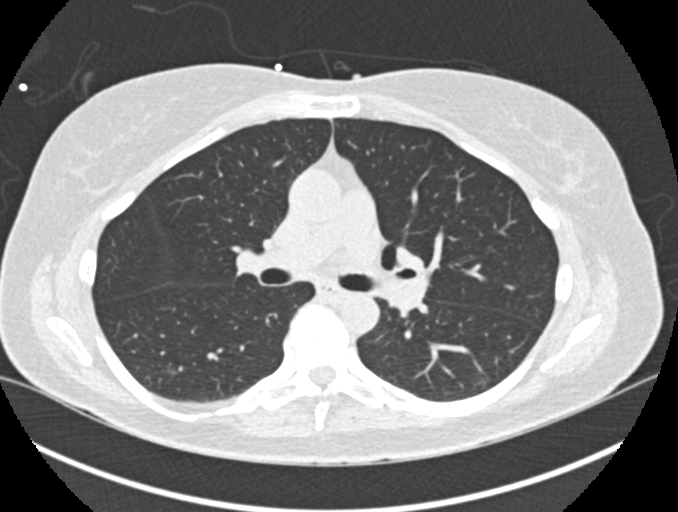

[Series 9: calcium scoring 2.00 br60 bestdiast 70% fov · axial · 0.55mm/px · z∈[+1565,+1669]mm · 5 of 80 slices shown]
[im 14/80  vessel]
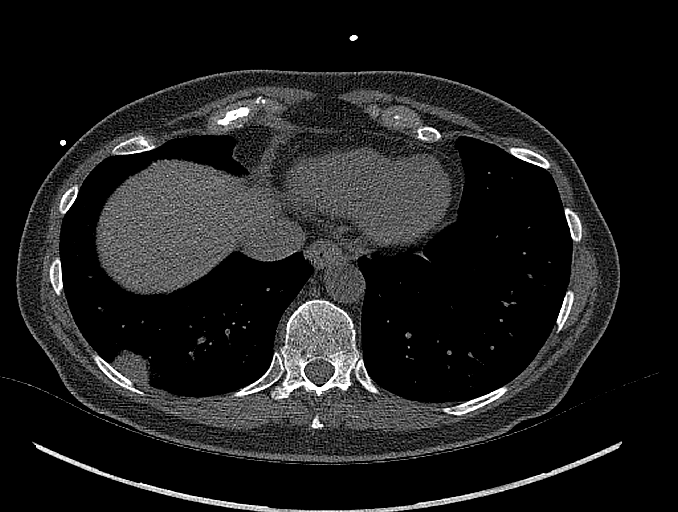
[im 27/80  vessel]
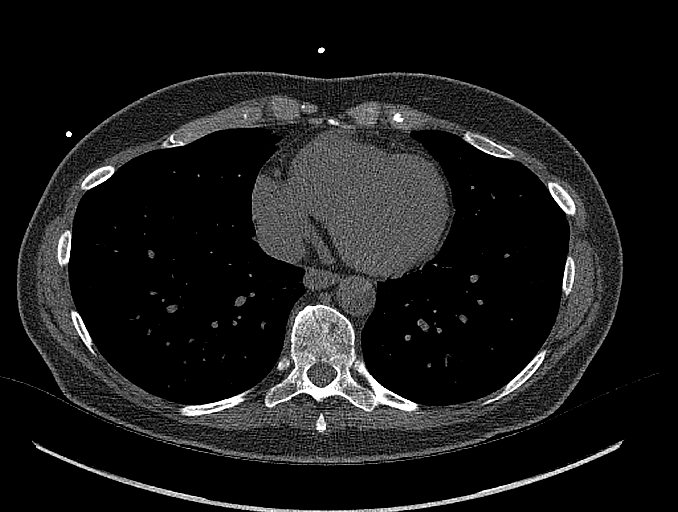
[im 40/80  vessel]
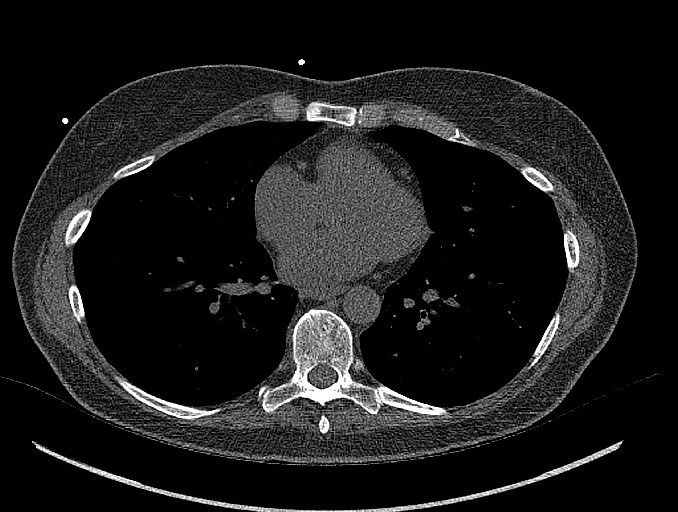
[im 53/80  vessel]
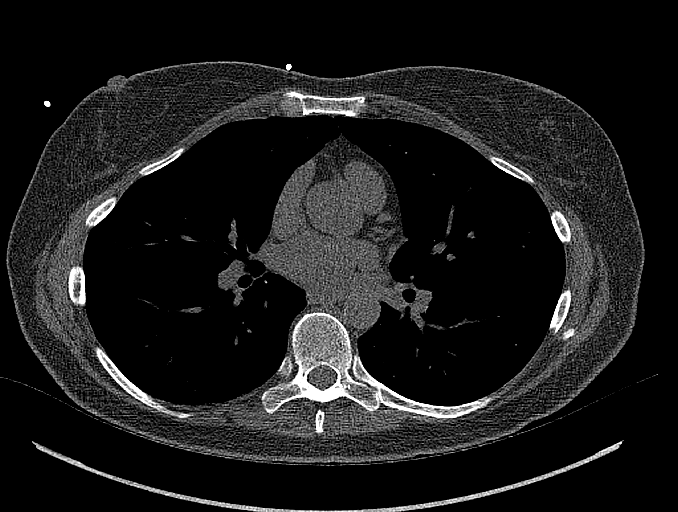
[im 66/80  vessel]
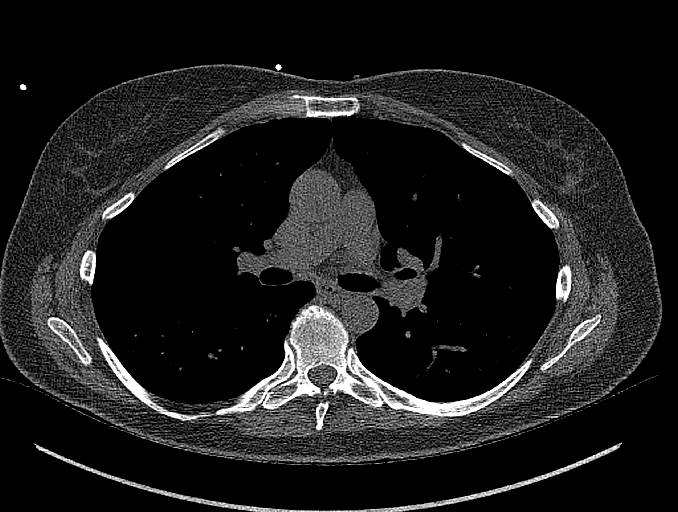

[13 of 20 positions shown; findings below may reference images not displayed]

FINDINGS: CORONARY CALCIUM SCORES:

Left Main: 0

LAD:

LCx: 0

RCA:

Total Agatston Score:

[HOSPITAL] percentile: 68th

AORTA MEASUREMENTS:

Ascending Aorta: 25 mm

Descending Aorta: 17 mm

OTHER FINDINGS:

Cardiovascular: Normal aortic caliber. Normal heart size, without
pericardial effusion.

Mediastinum/Nodes: No imaged thoracic adenopathy.

Lungs/Pleura: No pleural fluid. Left lower lobe subpleural pulmonary
nodule of 4 mm on 71/9.

More cephalad and medial 3 mm left lower lobe pulmonary nodule on
53/9.

Incompletely imaged, partially calcified pleural based mass within
the inferior right hemithorax. Example 8.1 x 4.0 cm on 78/3 and
sagittal image 50.

Upper Abdomen: Normal imaged portions of the liver, spleen, stomach.

Musculoskeletal: No imaged osseous destruction involving the lower
right ribs. Moderate thoracic spondylosis.
IMPRESSION: 1. Total Agatston score of 51.8, corresponding to 68th percentile
for age, sex, and race based cohort.
2. Incompletely imaged pleural-based mass within the inferior right
hemithorax. Possibly a fibrous tumor of the pleura. Recommend
dedicated contrast-enhanced diagnostic chest CT.
3. Left lower lobe pulmonary nodules of maximally 4 mm. No follow-up
needed if patient is low-risk. Non-contrast chest CT can be
considered in 12 months if patient is high-risk. This recommendation
follows the consensus statement: Guidelines for Management of
Incidental Pulmonary Nodules Detected on CT Images: From the

These results will be called to the ordering clinician or
representative by the Radiologist Assistant, and communication
documented in the PACS or zVision Dashboard.

## 2021-12-25 DIAGNOSIS — Z23 Encounter for immunization: Secondary | ICD-10-CM | POA: Diagnosis not present

## 2022-04-06 DIAGNOSIS — D2371 Other benign neoplasm of skin of right lower limb, including hip: Secondary | ICD-10-CM | POA: Diagnosis not present

## 2022-04-06 DIAGNOSIS — L821 Other seborrheic keratosis: Secondary | ICD-10-CM | POA: Diagnosis not present

## 2022-04-06 DIAGNOSIS — L814 Other melanin hyperpigmentation: Secondary | ICD-10-CM | POA: Diagnosis not present

## 2022-04-06 DIAGNOSIS — L570 Actinic keratosis: Secondary | ICD-10-CM | POA: Diagnosis not present

## 2022-04-06 DIAGNOSIS — D225 Melanocytic nevi of trunk: Secondary | ICD-10-CM | POA: Diagnosis not present

## 2022-04-06 DIAGNOSIS — L218 Other seborrheic dermatitis: Secondary | ICD-10-CM | POA: Diagnosis not present

## 2022-04-06 DIAGNOSIS — B353 Tinea pedis: Secondary | ICD-10-CM | POA: Diagnosis not present

## 2022-04-06 DIAGNOSIS — D2261 Melanocytic nevi of right upper limb, including shoulder: Secondary | ICD-10-CM | POA: Diagnosis not present

## 2022-04-06 DIAGNOSIS — Z85828 Personal history of other malignant neoplasm of skin: Secondary | ICD-10-CM | POA: Diagnosis not present

## 2022-04-07 DIAGNOSIS — I2584 Coronary atherosclerosis due to calcified coronary lesion: Secondary | ICD-10-CM | POA: Diagnosis not present

## 2022-04-07 DIAGNOSIS — E7841 Elevated Lipoprotein(a): Secondary | ICD-10-CM | POA: Diagnosis not present

## 2022-04-07 DIAGNOSIS — R7989 Other specified abnormal findings of blood chemistry: Secondary | ICD-10-CM | POA: Diagnosis not present

## 2022-04-07 DIAGNOSIS — Z1212 Encounter for screening for malignant neoplasm of rectum: Secondary | ICD-10-CM | POA: Diagnosis not present

## 2022-04-14 DIAGNOSIS — Z0000 Encounter for general adult medical examination without abnormal findings: Secondary | ICD-10-CM | POA: Diagnosis not present

## 2022-04-14 DIAGNOSIS — Z1331 Encounter for screening for depression: Secondary | ICD-10-CM | POA: Diagnosis not present

## 2022-04-14 DIAGNOSIS — D219 Benign neoplasm of connective and other soft tissue, unspecified: Secondary | ICD-10-CM | POA: Diagnosis not present

## 2022-04-14 DIAGNOSIS — G4700 Insomnia, unspecified: Secondary | ICD-10-CM | POA: Diagnosis not present

## 2022-04-14 DIAGNOSIS — R82998 Other abnormal findings in urine: Secondary | ICD-10-CM | POA: Diagnosis not present

## 2022-04-14 DIAGNOSIS — E7849 Other hyperlipidemia: Secondary | ICD-10-CM | POA: Diagnosis not present

## 2022-04-14 DIAGNOSIS — Z1339 Encounter for screening examination for other mental health and behavioral disorders: Secondary | ICD-10-CM | POA: Diagnosis not present

## 2022-04-14 DIAGNOSIS — I2584 Coronary atherosclerosis due to calcified coronary lesion: Secondary | ICD-10-CM | POA: Diagnosis not present

## 2022-04-16 DIAGNOSIS — Z780 Asymptomatic menopausal state: Secondary | ICD-10-CM | POA: Diagnosis not present

## 2022-04-16 DIAGNOSIS — Z01419 Encounter for gynecological examination (general) (routine) without abnormal findings: Secondary | ICD-10-CM | POA: Diagnosis not present

## 2022-04-16 DIAGNOSIS — Z1231 Encounter for screening mammogram for malignant neoplasm of breast: Secondary | ICD-10-CM | POA: Diagnosis not present

## 2022-10-10 DIAGNOSIS — M545 Low back pain, unspecified: Secondary | ICD-10-CM | POA: Diagnosis not present

## 2022-10-19 DIAGNOSIS — M545 Low back pain, unspecified: Secondary | ICD-10-CM | POA: Diagnosis not present

## 2022-10-19 DIAGNOSIS — M5416 Radiculopathy, lumbar region: Secondary | ICD-10-CM | POA: Diagnosis not present

## 2022-10-19 DIAGNOSIS — M47896 Other spondylosis, lumbar region: Secondary | ICD-10-CM | POA: Diagnosis not present

## 2022-10-23 DIAGNOSIS — M5416 Radiculopathy, lumbar region: Secondary | ICD-10-CM | POA: Diagnosis not present

## 2022-10-27 DIAGNOSIS — M47896 Other spondylosis, lumbar region: Secondary | ICD-10-CM | POA: Diagnosis not present

## 2022-10-27 DIAGNOSIS — M545 Low back pain, unspecified: Secondary | ICD-10-CM | POA: Diagnosis not present

## 2022-10-27 DIAGNOSIS — M5416 Radiculopathy, lumbar region: Secondary | ICD-10-CM | POA: Diagnosis not present

## 2022-10-28 DIAGNOSIS — M5416 Radiculopathy, lumbar region: Secondary | ICD-10-CM | POA: Diagnosis not present

## 2022-10-30 DIAGNOSIS — M5416 Radiculopathy, lumbar region: Secondary | ICD-10-CM | POA: Diagnosis not present

## 2022-11-02 DIAGNOSIS — M5416 Radiculopathy, lumbar region: Secondary | ICD-10-CM | POA: Diagnosis not present

## 2022-11-06 DIAGNOSIS — M5416 Radiculopathy, lumbar region: Secondary | ICD-10-CM | POA: Diagnosis not present

## 2022-11-11 DIAGNOSIS — M5416 Radiculopathy, lumbar region: Secondary | ICD-10-CM | POA: Diagnosis not present

## 2022-11-13 DIAGNOSIS — M5416 Radiculopathy, lumbar region: Secondary | ICD-10-CM | POA: Diagnosis not present

## 2022-11-16 DIAGNOSIS — M5416 Radiculopathy, lumbar region: Secondary | ICD-10-CM | POA: Diagnosis not present

## 2022-11-18 DIAGNOSIS — M5416 Radiculopathy, lumbar region: Secondary | ICD-10-CM | POA: Diagnosis not present

## 2022-11-23 DIAGNOSIS — M5416 Radiculopathy, lumbar region: Secondary | ICD-10-CM | POA: Diagnosis not present

## 2022-11-25 DIAGNOSIS — M5416 Radiculopathy, lumbar region: Secondary | ICD-10-CM | POA: Diagnosis not present

## 2022-12-07 DIAGNOSIS — M5416 Radiculopathy, lumbar region: Secondary | ICD-10-CM | POA: Diagnosis not present

## 2022-12-09 DIAGNOSIS — M5416 Radiculopathy, lumbar region: Secondary | ICD-10-CM | POA: Diagnosis not present

## 2022-12-15 DIAGNOSIS — M5416 Radiculopathy, lumbar region: Secondary | ICD-10-CM | POA: Diagnosis not present

## 2022-12-18 DIAGNOSIS — M5416 Radiculopathy, lumbar region: Secondary | ICD-10-CM | POA: Diagnosis not present

## 2022-12-21 DIAGNOSIS — M5416 Radiculopathy, lumbar region: Secondary | ICD-10-CM | POA: Diagnosis not present

## 2022-12-28 DIAGNOSIS — M5416 Radiculopathy, lumbar region: Secondary | ICD-10-CM | POA: Diagnosis not present

## 2022-12-31 DIAGNOSIS — M5416 Radiculopathy, lumbar region: Secondary | ICD-10-CM | POA: Diagnosis not present

## 2023-04-15 DIAGNOSIS — E7849 Other hyperlipidemia: Secondary | ICD-10-CM | POA: Diagnosis not present

## 2023-04-15 DIAGNOSIS — Z1212 Encounter for screening for malignant neoplasm of rectum: Secondary | ICD-10-CM | POA: Diagnosis not present

## 2023-04-15 DIAGNOSIS — E785 Hyperlipidemia, unspecified: Secondary | ICD-10-CM | POA: Diagnosis not present

## 2023-04-15 DIAGNOSIS — I2584 Coronary atherosclerosis due to calcified coronary lesion: Secondary | ICD-10-CM | POA: Diagnosis not present

## 2023-04-22 DIAGNOSIS — Z1331 Encounter for screening for depression: Secondary | ICD-10-CM | POA: Diagnosis not present

## 2023-04-22 DIAGNOSIS — D219 Benign neoplasm of connective and other soft tissue, unspecified: Secondary | ICD-10-CM | POA: Diagnosis not present

## 2023-04-22 DIAGNOSIS — I2584 Coronary atherosclerosis due to calcified coronary lesion: Secondary | ICD-10-CM | POA: Diagnosis not present

## 2023-04-22 DIAGNOSIS — G4700 Insomnia, unspecified: Secondary | ICD-10-CM | POA: Diagnosis not present

## 2023-04-22 DIAGNOSIS — Z0000 Encounter for general adult medical examination without abnormal findings: Secondary | ICD-10-CM | POA: Diagnosis not present

## 2023-04-22 DIAGNOSIS — E7849 Other hyperlipidemia: Secondary | ICD-10-CM | POA: Diagnosis not present

## 2023-04-22 DIAGNOSIS — Z1339 Encounter for screening examination for other mental health and behavioral disorders: Secondary | ICD-10-CM | POA: Diagnosis not present

## 2023-04-22 DIAGNOSIS — R82998 Other abnormal findings in urine: Secondary | ICD-10-CM | POA: Diagnosis not present

## 2023-04-28 DIAGNOSIS — Z780 Asymptomatic menopausal state: Secondary | ICD-10-CM | POA: Diagnosis not present

## 2023-04-28 DIAGNOSIS — Z1231 Encounter for screening mammogram for malignant neoplasm of breast: Secondary | ICD-10-CM | POA: Diagnosis not present

## 2023-04-28 DIAGNOSIS — Z01419 Encounter for gynecological examination (general) (routine) without abnormal findings: Secondary | ICD-10-CM | POA: Diagnosis not present

## 2023-07-30 DIAGNOSIS — J189 Pneumonia, unspecified organism: Secondary | ICD-10-CM | POA: Diagnosis not present
